# Patient Record
Sex: Female | Born: 1955 | Race: White | Hispanic: No | Marital: Married | Smoking: Former smoker
Health system: Southern US, Community
[De-identification: ages and names within clinical notes are randomized; demographics above are authoritative.]

## PROBLEM LIST (undated history)

## (undated) DIAGNOSIS — M549 Dorsalgia, unspecified: Secondary | ICD-10-CM

## (undated) DIAGNOSIS — F32A Depression, unspecified: Secondary | ICD-10-CM

## (undated) DIAGNOSIS — F329 Major depressive disorder, single episode, unspecified: Secondary | ICD-10-CM

## (undated) DIAGNOSIS — M1990 Unspecified osteoarthritis, unspecified site: Secondary | ICD-10-CM

## (undated) DIAGNOSIS — H269 Unspecified cataract: Secondary | ICD-10-CM

## (undated) DIAGNOSIS — G8929 Other chronic pain: Secondary | ICD-10-CM

## (undated) DIAGNOSIS — F419 Anxiety disorder, unspecified: Secondary | ICD-10-CM

## (undated) DIAGNOSIS — K219 Gastro-esophageal reflux disease without esophagitis: Secondary | ICD-10-CM

## (undated) DIAGNOSIS — M542 Cervicalgia: Secondary | ICD-10-CM

## (undated) HISTORY — PX: BREAST SURGERY: SHX581

## (undated) HISTORY — DX: Major depressive disorder, single episode, unspecified: F32.9

## (undated) HISTORY — PX: EYE SURGERY: SHX253

## (undated) HISTORY — DX: Other chronic pain: G89.29

## (undated) HISTORY — DX: Gastro-esophageal reflux disease without esophagitis: K21.9

## (undated) HISTORY — DX: Depression, unspecified: F32.A

## (undated) HISTORY — DX: Dorsalgia, unspecified: M54.9

## (undated) HISTORY — PX: CHOLECYSTECTOMY: SHX55

## (undated) HISTORY — DX: Unspecified cataract: H26.9

## (undated) HISTORY — DX: Unspecified osteoarthritis, unspecified site: M19.90

## (undated) HISTORY — DX: Cervicalgia: M54.2

## (undated) HISTORY — PX: OTHER SURGICAL HISTORY: SHX169

## (undated) HISTORY — PX: APPENDECTOMY: SHX54

## (undated) HISTORY — PX: ABDOMINAL HYSTERECTOMY: SHX81

## (undated) HISTORY — PX: FRACTURE SURGERY: SHX138

## (undated) HISTORY — PX: TONSILLECTOMY: SUR1361

## (undated) HISTORY — DX: Anxiety disorder, unspecified: F41.9

## (undated) HISTORY — PX: CERVICAL SPINE SURGERY: SHX589

## (undated) HISTORY — PX: BACK SURGERY: SHX140

## (undated) HISTORY — PX: WRIST SURGERY: SHX841

---

## 1998-07-25 ENCOUNTER — Ambulatory Visit (HOSPITAL_COMMUNITY): Admission: RE | Admit: 1998-07-25 | Discharge: 1998-07-25 | Payer: Self-pay | Admitting: Family Medicine

## 1998-07-25 ENCOUNTER — Encounter: Payer: Self-pay | Admitting: Family Medicine

## 1999-01-03 ENCOUNTER — Encounter: Payer: Self-pay | Admitting: Obstetrics and Gynecology

## 1999-01-03 ENCOUNTER — Encounter: Admission: RE | Admit: 1999-01-03 | Discharge: 1999-01-03 | Payer: Self-pay | Admitting: Obstetrics and Gynecology

## 2000-06-03 ENCOUNTER — Ambulatory Visit (HOSPITAL_COMMUNITY): Admission: RE | Admit: 2000-06-03 | Discharge: 2000-06-03 | Payer: Self-pay | Admitting: Family Medicine

## 2000-06-03 ENCOUNTER — Encounter: Payer: Self-pay | Admitting: Family Medicine

## 2001-07-06 ENCOUNTER — Ambulatory Visit (HOSPITAL_COMMUNITY): Admission: RE | Admit: 2001-07-06 | Discharge: 2001-07-06 | Payer: Self-pay | Admitting: Family Medicine

## 2001-07-06 ENCOUNTER — Encounter: Payer: Self-pay | Admitting: Family Medicine

## 2003-09-14 ENCOUNTER — Ambulatory Visit (HOSPITAL_COMMUNITY): Admission: RE | Admit: 2003-09-14 | Discharge: 2003-09-14 | Payer: Self-pay | Admitting: Plastic Surgery

## 2003-09-14 ENCOUNTER — Ambulatory Visit (HOSPITAL_BASED_OUTPATIENT_CLINIC_OR_DEPARTMENT_OTHER): Admission: RE | Admit: 2003-09-14 | Discharge: 2003-09-14 | Payer: Self-pay | Admitting: Plastic Surgery

## 2003-09-14 ENCOUNTER — Encounter (INDEPENDENT_AMBULATORY_CARE_PROVIDER_SITE_OTHER): Payer: Self-pay | Admitting: Specialist

## 2003-09-25 ENCOUNTER — Ambulatory Visit (HOSPITAL_COMMUNITY): Admission: RE | Admit: 2003-09-25 | Discharge: 2003-09-25 | Payer: Self-pay | Admitting: Family Medicine

## 2005-08-07 ENCOUNTER — Inpatient Hospital Stay (HOSPITAL_COMMUNITY): Admission: RE | Admit: 2005-08-07 | Discharge: 2005-08-07 | Payer: Self-pay | Admitting: Neurological Surgery

## 2005-08-18 ENCOUNTER — Encounter: Admission: RE | Admit: 2005-08-18 | Discharge: 2005-08-18 | Payer: Self-pay | Admitting: Neurological Surgery

## 2005-10-27 ENCOUNTER — Encounter: Admission: RE | Admit: 2005-10-27 | Discharge: 2005-10-27 | Payer: Self-pay | Admitting: Neurological Surgery

## 2006-02-01 ENCOUNTER — Encounter: Admission: RE | Admit: 2006-02-01 | Discharge: 2006-02-01 | Payer: Self-pay | Admitting: Neurological Surgery

## 2006-03-15 ENCOUNTER — Encounter: Admission: RE | Admit: 2006-03-15 | Discharge: 2006-03-15 | Payer: Self-pay | Admitting: Neurological Surgery

## 2006-06-21 ENCOUNTER — Encounter: Admission: RE | Admit: 2006-06-21 | Discharge: 2006-06-21 | Payer: Self-pay | Admitting: Neurological Surgery

## 2006-07-06 ENCOUNTER — Encounter: Admission: RE | Admit: 2006-07-06 | Discharge: 2006-07-06 | Payer: Self-pay | Admitting: Neurological Surgery

## 2006-09-06 ENCOUNTER — Encounter: Admission: RE | Admit: 2006-09-06 | Discharge: 2006-09-06 | Payer: Self-pay | Admitting: Neurological Surgery

## 2006-09-09 ENCOUNTER — Encounter: Admission: RE | Admit: 2006-09-09 | Discharge: 2006-09-09 | Payer: Self-pay | Admitting: Neurological Surgery

## 2006-09-30 ENCOUNTER — Ambulatory Visit (HOSPITAL_COMMUNITY): Admission: RE | Admit: 2006-09-30 | Discharge: 2006-09-30 | Payer: Self-pay | Admitting: Neurological Surgery

## 2007-03-21 ENCOUNTER — Encounter: Admission: RE | Admit: 2007-03-21 | Discharge: 2007-03-21 | Payer: Self-pay | Admitting: Neurological Surgery

## 2007-03-29 ENCOUNTER — Encounter: Admission: RE | Admit: 2007-03-29 | Discharge: 2007-03-29 | Payer: Self-pay | Admitting: Neurological Surgery

## 2007-04-27 ENCOUNTER — Inpatient Hospital Stay (HOSPITAL_COMMUNITY): Admission: RE | Admit: 2007-04-27 | Discharge: 2007-05-01 | Payer: Self-pay | Admitting: Neurological Surgery

## 2007-05-24 ENCOUNTER — Encounter: Admission: RE | Admit: 2007-05-24 | Discharge: 2007-05-24 | Payer: Self-pay | Admitting: Neurological Surgery

## 2007-06-20 IMAGING — CR DG CERVICAL SPINE 1V
1 series · 1 of 1 positions shown · non-contrast
Comparison: 08/05/05 operative films.

CLINICAL DATA: Status post ACDF.  
 SINGLE LATERAL CERVICAL SPINE:

[w c-spine lat]
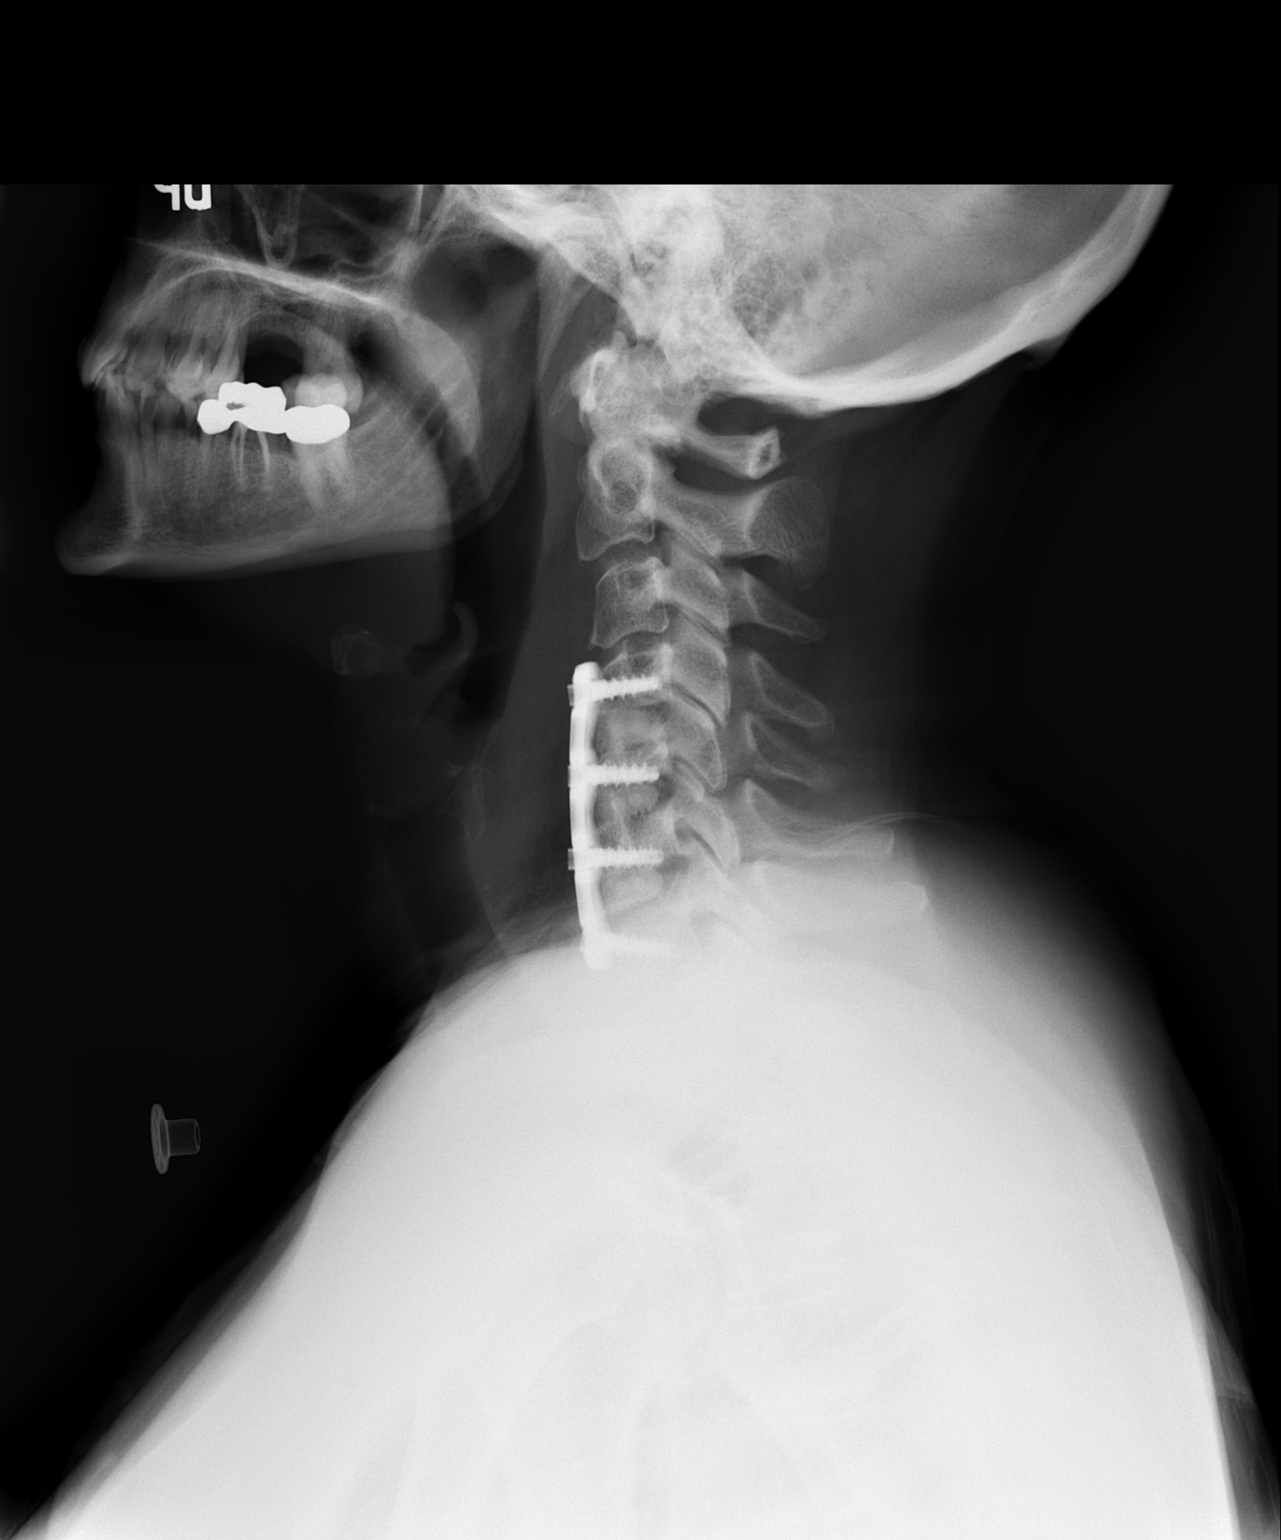

[1 of 1 positions shown; findings below may reference images not displayed]

A single lateral view of the cervical spine shows the patient to be status post diskectomy at C4-5, C5-6, and C6-7.  There is anterior plating spanning from the C4 vertebral body to C7 inclusively.  There is straightening of the normal lordosis with normal anatomic alignment.  Intervertebral spaces are well preserved at the fused levels.  No evidence for hardware complications.  Mild prominence of the prevertebral soft tissues noted.
IMPRESSION: 1.  Status post cervical fusion from C4 to C7 inclusively.  No hardware complications.
 2.  Mild prominence of the prevertebral soft tissues.

## 2007-07-25 ENCOUNTER — Encounter: Admission: RE | Admit: 2007-07-25 | Discharge: 2007-07-25 | Payer: Self-pay | Admitting: Neurological Surgery

## 2007-10-28 ENCOUNTER — Encounter: Admission: RE | Admit: 2007-10-28 | Discharge: 2007-10-28 | Payer: Self-pay | Admitting: Neurological Surgery

## 2008-04-23 ENCOUNTER — Encounter: Admission: RE | Admit: 2008-04-23 | Discharge: 2008-04-23 | Payer: Self-pay | Admitting: Neurological Surgery

## 2009-01-27 ENCOUNTER — Emergency Department (HOSPITAL_COMMUNITY): Admission: EM | Admit: 2009-01-27 | Discharge: 2009-01-27 | Payer: Self-pay | Admitting: Emergency Medicine

## 2009-01-28 IMAGING — RF DG MYELOGRAM CERVICAL
12 series · 12 of 12 positions shown · non-contrast
Comparison: Plain film radiographs 03/21/07 and MRI of the cervical spine 09/09/06.
COMPARISON: MRI 09/09/07.

CLINICAL DATA: 51-year-old female, neck pain.  
CERVICAL MYELOGRAM:
TECHNIQUE: Multidetector CT imaging of the cervical spine was performed after intrathecal injection of contrast.  Multiplanar CT image reconstructions were also generated.

[Series 1: (hospital) · 1 of 1 slices shown (1 of 2)]
[im 1/1]
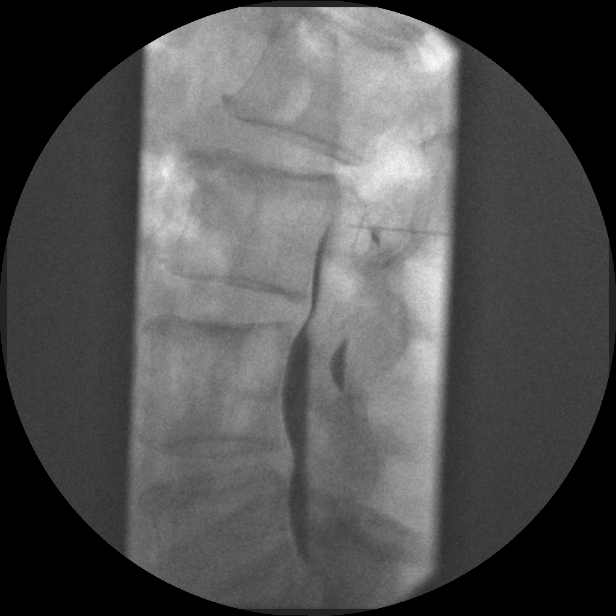

[Series 2: (hospital) · 1 of 1 slices shown (2 of 2)]
[im 1/1]
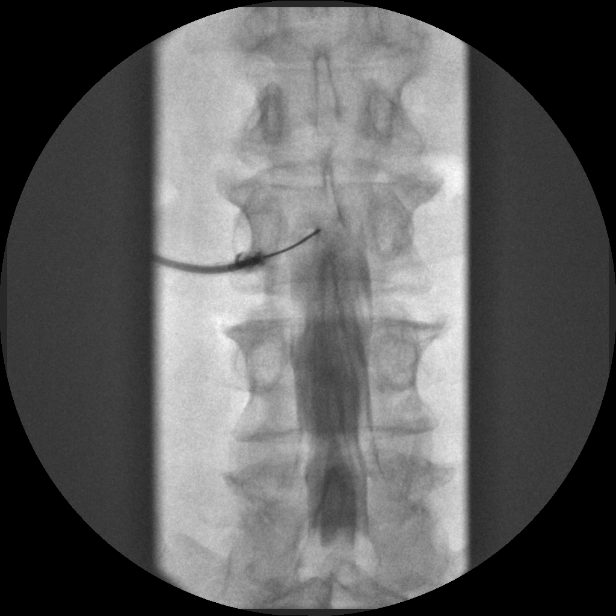

[Series 3: myelogram  white · 1 of 1 slices shown (1 of 10)]
[im 1/1]
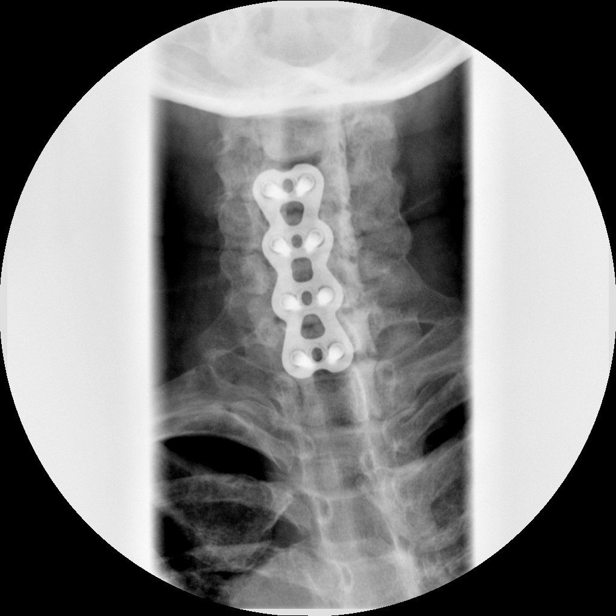

[Series 4: myelogram  white · 1 of 1 slices shown (2 of 10)]
[im 1/1]
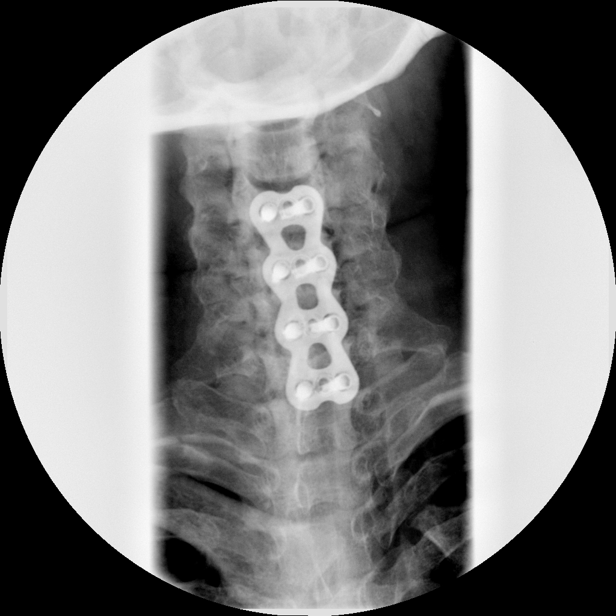

[Series 5: myelogram  white · 1 of 1 slices shown (3 of 10)]
[im 1/1]
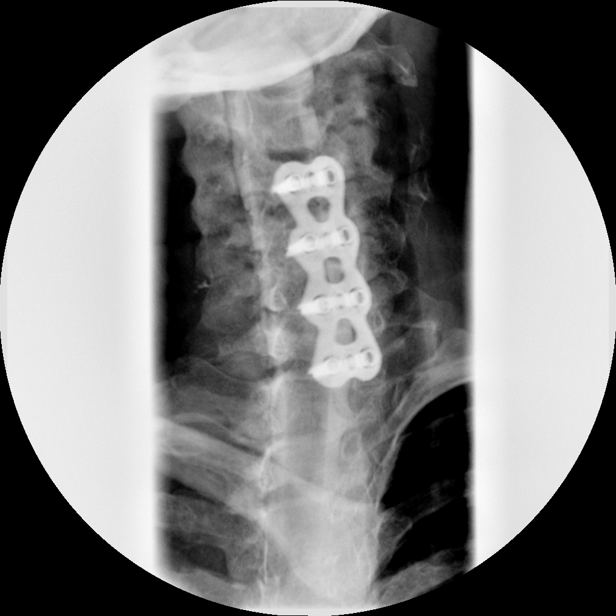

[Series 6: myelogram  white · 1 of 1 slices shown (4 of 10)]
[im 1/1]
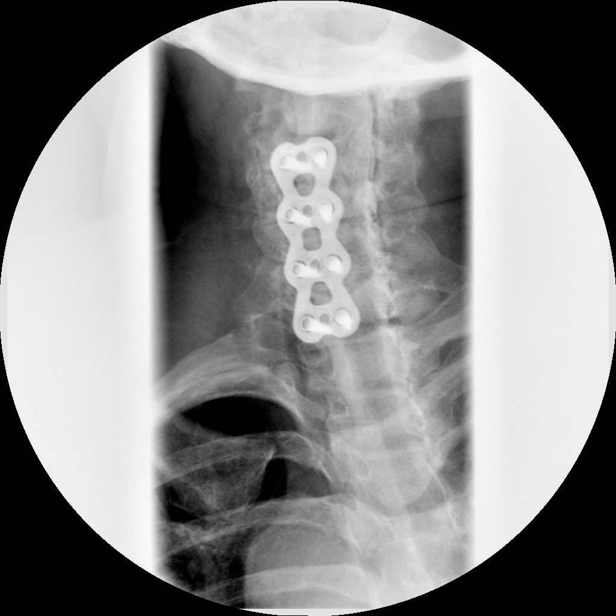

[Series 7: myelogram  white · 1 of 1 slices shown (5 of 10)]
[im 1/1]
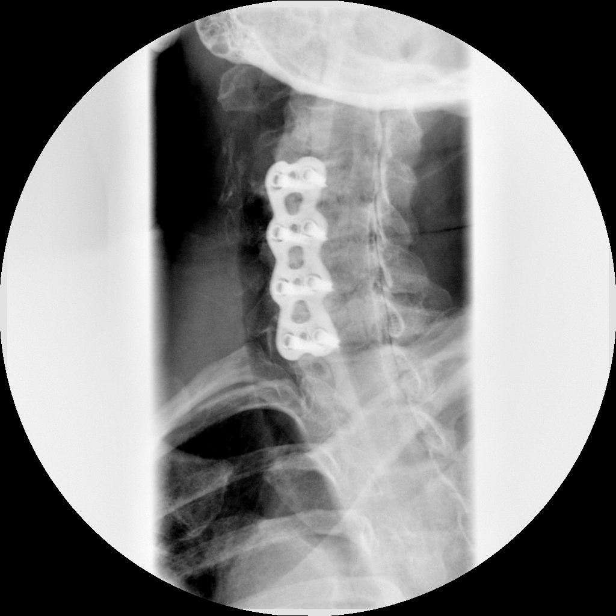

[Series 8: myelogram  white · 1 of 1 slices shown (6 of 10)]
[im 1/1]
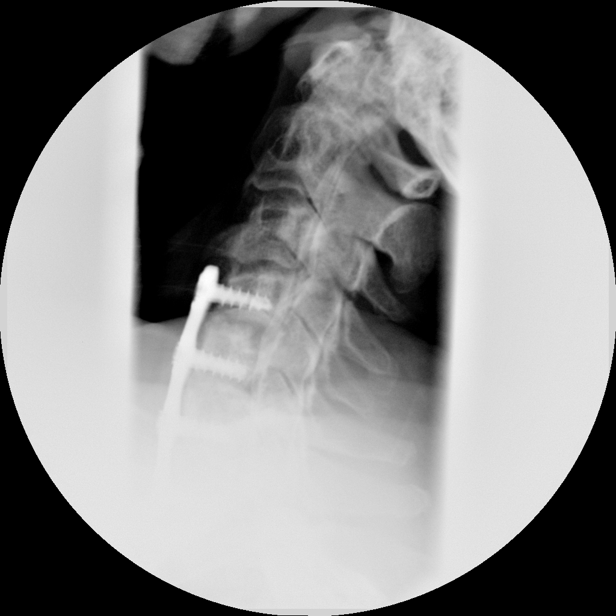

[Series 9: myelogram  white · 1 of 1 slices shown (7 of 10)]
[im 1/1]
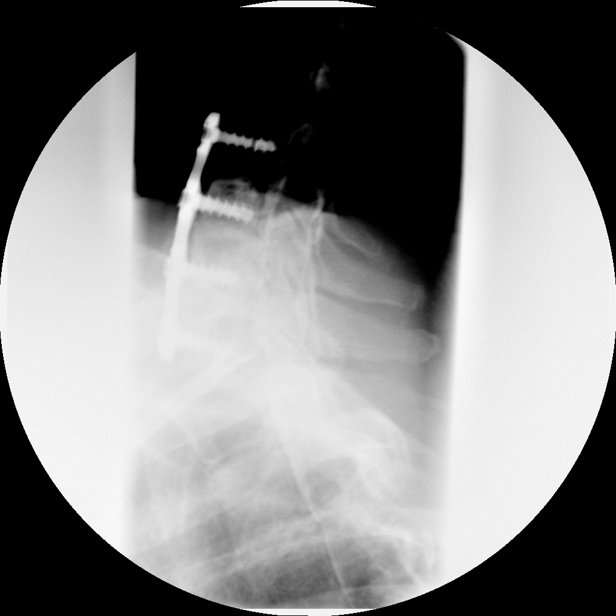

[Series 10: myelogram  white · 1 of 1 slices shown (8 of 10)]
[im 1/1]
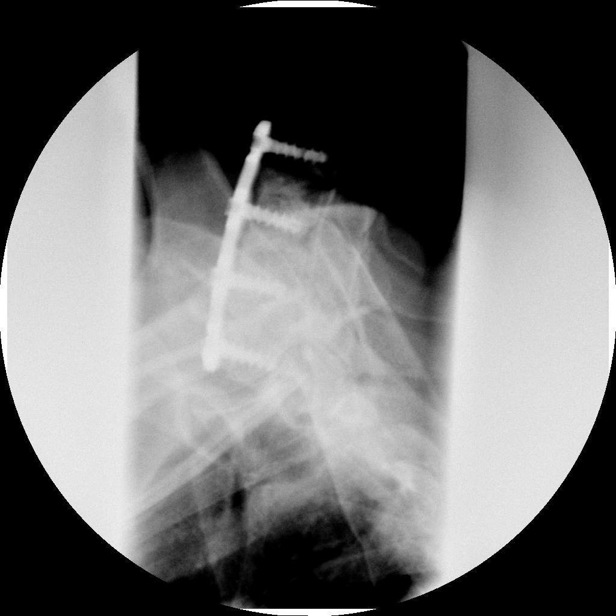

[Series 11: myelogram  white · 1 of 1 slices shown (9 of 10)]
[im 1/1]
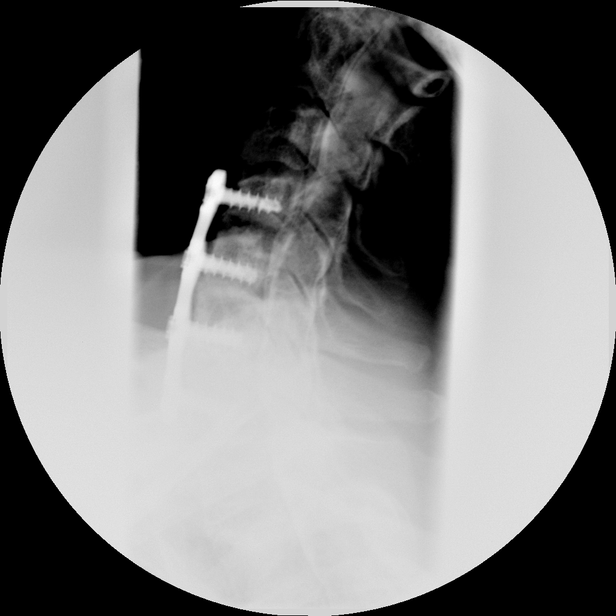

[Series 12: myelogram  white · 1 of 1 slices shown (10 of 10)]
[im 1/1]
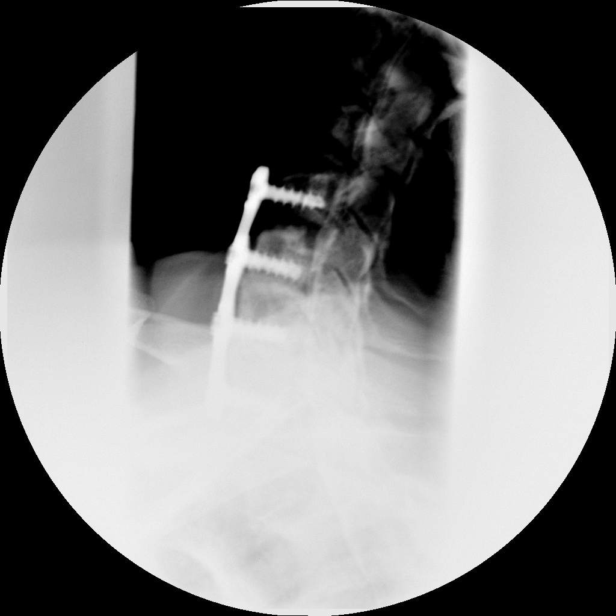

[12 of 12 positions shown; findings below may reference images not displayed]

Operator:  Lkw Tiger, M.D.
Procedure:  After obtained informed written consent for the procedure the patient was brought to fluoroscopy.  She was positioned prone.  The back was prepped and draped in the usual sterile fashion.  The L2-3 interspace was localized.  Superficial soft tissues were anesthetized with 1% Lidocaine.  A 22 gauge spinal needle was then advanced into the subarachnoid space from a left paramedian approach.  Clear CSF was returned.  Initial injection demonstrated a somewhat globular pattern.  I looked on the lateral and saw that contrast was in either the epidural or subdural space with minimal in the subarachnoid space.  I therefore replaced the stylette and advanced the needle slightly through the central portion of the canal.  Further injection of contrast was in the subarachnoid space.  A total of 10 cc of Omnipaque 300 was injected.  
The stylette was then removed.  The patient had some complaints of pressure in her legs.  She was then moved into a Trendelenburg position and contrast was followed into the cervical region.
FINDINGS: Conventional images demonstrate a disc bulge at the C3-4 level.  The patient was status-post fusion at C3-7.  Surgical hardware is intact.  Please see CT for further detail.  There is also a focal disc bulge at C7-T1.  The nerve roots appear to fill bilaterally.
IMPRESSION: 1.  Status-post ACDF C4-7.  
2.  Disc bulging at C3-4 and also at C7-T1. 
CT CERVICAL SPINE WITH CONTRAST (POST-MYELOGRAM):
FINDINGS: The patient is status-post anterior cervical discectomy and fusion C4-5.  There is lucency above the graft material at C4-5 and the C5-6 level.  Lucency is seen below the graft material at C6-7.  The screws are intact.  The anterior plate is intact.  The craniocervical junction is unremarkable.  There is slight anterolisthesis of C-3 on C-4, measuring 2 to 3 mm.  Alignment is otherwise maintained.  Individual disc levels are as follows:
There is ossification along the posteromedial aspect of the left C1-2 joint.  This is similar to the prior studies.  There is no associated stenosis.  
C2-3:  Slight anterolisthesis is stable measuring 1 to 2 mm.  There is facet spurring and asymmetric facet arthropathy on the right.  Facet spurring encroaches on the posterior aspect of the neural foramen.  The left facet and foramen are patent. 
C3-4:  As stated anterolisthesis of 2 to 3 mm.  There is a ventral disc protrusion which contacts and indents the ventral surface of the cord.  Uncovertebral spurring on the right contributes to mild foraminal narrowing. 
C4-5:  Patient fused at this level.  There is diffuse osteophytic ridging which effaces the ventral CSF without definitely contacting the cord.  Mild biforaminal narrowing is on the basis of uncovertebral spurring. 
C5-6:  Mild biforaminal narrowing is worse right than left.  This is primarily due to uncovertebral spurring.  Facet arthropathy is present. 
C6-7:  Central canals patent.  Mild biforaminal narrowing due to uncovertebral spurring.  
C7-T1:  A broad based central disc bulge contacts the ventral surface of the cord, effacing the CSF.  The foramina are patent.  
There is no significantdisc disease in the upper thoracic spine and the lung apices are clear.  The soft tissues are unremarkable.
IMPRESSION: 1.  Status-post ACDF C4-7.  There is no clear bridging bone at any fused levels. 
2.  Ventral disc protrusion with contract of the cord and mild to moderate central canal stenosis at C3-4. 
3.  Multilevel foraminal narrowing as described above due to uncovertebral spurring.  This appears to be greatest at C5-6 and C6-7, right greater than left. 
4.  Broad based disc bulging at C7-T1, just below the fusion.
5.  Slight anterolisthesis of C-2 on C-3 and C-3 on C-4.

## 2009-08-12 ENCOUNTER — Encounter: Admission: RE | Admit: 2009-08-12 | Discharge: 2009-08-12 | Payer: Self-pay | Admitting: Internal Medicine

## 2010-05-20 LAB — HEPATIC FUNCTION PANEL
ALT: 20 U/L (ref 0–35)
AST: 25 U/L (ref 0–37)
Albumin: 4.3 g/dL (ref 3.5–5.2)
Alkaline Phosphatase: 71 U/L (ref 39–117)
Total Protein: 6.9 g/dL (ref 6.0–8.3)

## 2010-05-20 LAB — DIFFERENTIAL
Eosinophils Absolute: 0 10*3/uL (ref 0.0–0.7)
Lymphocytes Relative: 16 % (ref 12–46)
Lymphs Abs: 1.7 10*3/uL (ref 0.7–4.0)
Neutro Abs: 8.3 10*3/uL — ABNORMAL HIGH (ref 1.7–7.7)
Neutrophils Relative %: 77 % (ref 43–77)

## 2010-05-20 LAB — BASIC METABOLIC PANEL
BUN: 21 mg/dL (ref 6–23)
Calcium: 8.9 mg/dL (ref 8.4–10.5)
Creatinine, Ser: 0.61 mg/dL (ref 0.4–1.2)
GFR calc Af Amer: >60 mL/min (ref >60)
GFR calc non Af Amer: >60 mL/min (ref >60)

## 2010-05-20 LAB — URINALYSIS, ROUTINE W REFLEX MICROSCOPIC
Ketones, ur: NEGATIVE mg/dL
Nitrite: NEGATIVE
Protein, ur: NEGATIVE mg/dL
pH: 7 (ref 5.0–8.0)

## 2010-05-20 LAB — CBC
Platelets: 256 10*3/uL (ref 150–400)
RBC: 4.3 MIL/uL (ref 3.87–5.11)
WBC: 10.8 10*3/uL — ABNORMAL HIGH (ref 4.0–10.5)

## 2010-07-01 NOTE — Op Note (Signed)
[name], [name]               ACCOUNT NO.:  [id number]   MEDICAL RECORD NO.:  [id number]          PATIENT TYPE:  INP   LOCATION:  2899                         FACILITY:  [facility]   PHYSICIAN:  Renee Alert, MD     DATE OF BIRTH:  August 26, 1955   DATE OF PROCEDURE:  04/27/2007  DATE OF DISCHARGE:                               OPERATIVE REPORT   PREOPERATIVE DIAGNOSIS:  1. Cervical disc herniation with cervical spondylosis and cervical      spinal stenosis C3-C4.  2. Pseudoarthrosis C4-C5, C5-C6, and C6-C7, status post anterior      cervical discectomy fusion and plating at those levels in the      remote past.   POSTOPERATIVE DIAGNOSIS:  1. Cervical disc herniation with cervical spondylosis and cervical      spinal stenosis C3-C4.  2. Pseudoarthrosis C4-C5, C5-C6, and C6-C7, status post anterior      cervical discectomy fusion and plating at those levels in the      remote past.   PROCEDURE:  1. Anterior cervical re-exploration with exploration of fusion C4-C5,      C5-C6, and C6-C7, with removal of anterior cervical hardware C4 to      C7.  2. Decompressive anterior cervical discectomy C3-C4 for central canal      decompression.  3. Anterior cervical arthrodesis C3-C4 utilizing a 7 mm      corticocancellous allograft.  4. Anterior cervical plating C3-C4 utilizing a 23 mm Zephir plate.  5. Posterior cervical fusion C3 to C7 utilizing Actifuse putty.  6. Posterior cervical segmental fixation C3 to C7 utilizing the Vertex      lateral mass screw system.   SURGEON:  [name], M.D.   ASSISTANT:  [name]. Pool, M.D.   ANESTHESIA:  General endotracheal anesthesia.   COMPLICATIONS:  None apparent.   INDICATIONS FOR PROCEDURE:  [name] is a 55 year old female who  underwent a three level anterior cervical discectomy in the remote past.  She had return of neck pain with bilateral arm pain with some numbness  in her left arm.  She had a MRI and then a CT myelogram  which showed a  pseudoarthrosis at C4-C5, C5-C6 and C6-C7.  There was a question that  she could have been fused at C5-C6, but C4-C5 and C6-C7 certainly looked  like they had a pseudoarthrosis.  She had an adjacent level disc  herniation at C3-C4 with what appeared to be a broken screw at C4. There  was stenosis at C3-C4. Because of the pseudoarthrosis and the stenosis  and return of symptoms, I recommended a cervical re-exploration with  removal of the anterior cervical plate followed by anterior cervical  discectomy fusion with plating C3-C4 to decompress the central canal  followed by posterior cervical instrumented fusion from C3 to C7 to  address her pseudoarthrosis.  She understood the risks, benefits, and  expected outcome and she wished to proceed.   DESCRIPTION OF PROCEDURE:  The patient was taken to the operating room  and after induction of adequate generalized endotracheal anesthesia, she  was placed in  the supine position on the operating room table.  Her  right anterior cervical region was prepped with DuraPrep and draped in  the usual sterile fashion.  4 mL local anesthesia was injected and a  transverse incision was made to the right of midline and carried down to  the platysma which was elevated, opened, and undermined with Metzenbaum  scissors.  I then dissected in a plane medial to the sternocleidomastoid  muscle and internal carotid artery and lateral to the trachea and  esophagus to expose the old plate and [id number].  Blunt dissection was used  to expose the plate followed by Bovie cautery.   The screws were removed and the plate was removed.  I inspected the  fusion.  It was difficult to tell whether she had a fusion or not from  this approach.  Therefore, I took down the longus colli muscle and  exposed the C3-C4 disc space.  Intraoperative fluoroscopy confirmed my  level at C3-4 and then the annulus was incised and the initial  discectomy was done with pituitary  rongeurs and curved curettes.  I then  used the high speed drill to drill the endplates at C3 and C4 to prepare  for arthrodesis.  I widened the disc to a height of 7 mm.  I drilled  laterally to the uncovertebral joints, drilling in a rectangular  fashion.  I drilled down to the level of the posterior longitudinal  ligament, the operating microscope was brought to the field.  The  posterior longitudinal ligament was opened with a black nerve hook and  then removed while undercutting the bodies of C3 and C4 to decompress  the central canal, especially the underside of C3.  I angled the scope  up and down to undercut the vertebral bodies.  Bilateral foraminotomies  were performed. The takeoff of the C4 nerve roots was identified.  I  then palpated with a nerve hook in a circumferential fashion, the dura  was no longer pushed away, it was now full and capacious all the way  across.  I felt like I had a good decompression at C3-C4.  Therefore, I  measured the interspace and used a 7 mm corticocancellous allograft,  tapped this in position at C3-C4, to complete the arthrodesis.  I was  going to use a Venture plate.  However, her spine at this level was too  narrow and, therefore, a Zephir plate was used.  Two 13 mm variable  angle screws were placed into the body of C3. At C4, I could place a  screw on the right but no screw could be placed on the patient's left  because of the remaining half of the broken screw.  Therefore, I  irrigated with saline solution containing bacitracin, dried all bleeding  points, and once meticulous hemostasis was achieved, placed a 7 flat JP  drain through a separate stab incision and closed the platysma with 3-0  Vicryl, closed the subcuticular tissue with 3-0 Vicryl, and closed the  skin with Benzoin and Steri-Strips.   The patient was then placed on the stretcher, once again. Her head was  affixed in a three point Mayfield headrest, and she was rolled into  the  prone position on chest rolls for the posterior cervical procedure.  The  posterior cervical region was prepped with DuraPrep and draped in the  usual sterile fashion.  10 mL of local anesthesia was injected. A dorsal  midline incision was made and carried down to the  cervical fascia.  The  fascia was opened and the paraspinous musculature was taken down in a  subperiosteal periosteal fashion to expose C3 to C7 inclusive  bilaterally.  Intraoperative fluoroscopy confirmed my level and then I  located the entry zones of the lateral mass screws. 1 mm medial to the  mid point of the lateral mass, I then drilled in an upward and outward  direction utilizing the hand drill with the 12 mm drill guide, drilling  into the safe zone of each lateral mass.  I palpated each lateral mass  with a ball probe after drilling and then used 12 mm lateral mass screws  placed in an upward and outward direction, again into the safe zone of  the lateral mass. Each one achieved a nice solid fit.  I then placed two  lordotic rods into the multiaxial screw heads and locked these into  position with the locking caps and the anti-torque device.  I then  decorticated the lateral masses and placed Actifuse out over these to  perform arthrodesis at C3 to C7 bilaterally.  We then placed a medium  Hemovac drain, dried all bleeding points, irrigated with saline solution  containing bacitracin, and then closed the fascia with 0 Vicryl, closed  the subcutaneous and subcuticular tissue with  2-0 and 3-0 Vicryl, closed the skin with Benzoin and Steri-Strips.  The  drapes were removed.  A sterile dressing was applied.  The patient was  awakened from general anesthesia and transferred to the recovery room in  stable condition.  At the end of the procedure, all sponge, needle and  instrument counts were correct.      Renee Alert, MD  Electronically Signed     DSJ/MEDQ  D:  04/27/2007  T:  04/28/2007  Job:   [id number]

## 2010-07-01 NOTE — Op Note (Signed)
[name], [name]               ACCOUNT NO.:  [id number]   MEDICAL RECORD NO.:  [id number]          PATIENT TYPE:  AMB   LOCATION:  SDS                          FACILITY:  [facility]   PHYSICIAN:  Tia Alert, MD     DATE OF BIRTH:  [birth year]   DATE OF PROCEDURE:  09/30/2006  DATE OF DISCHARGE:                               OPERATIVE REPORT   PREOPERATIVE DIAGNOSIS:  Hypertrophic cervical scar status post anterior  cervical diskectomy.   POSTOPERATIVE DIAGNOSIS:  Hypertrophic cervical scar status post  anterior cervical diskectomy.   PROCEDURE:  Excision of hypertrophic scar measuring 1 cm in length.   SURGEON:  Dr. Marikay Alar.   ANESTHESIA:  Local.   INDICATIONS FOR PROCEDURE:  [name] is a 55 year old female who  underwent a anterior cervical diskectomy fusion plating at three levels  over a year ago.  She had a small lesion in the wound that was firm and  hypermobile, which appeared to be some type of a small granuloma where  she likely had a previous small stitch abscess.  This bothers her  cosmetically and also was associated with some tenderness.  She asked  for it to be excised.  She understood the risks, benefits, expected  outcome and wished to proceed.   DESCRIPTION OF PROCEDURE:  The patient was taken to operating room and  placed on the operating room table.  Her right anterior cervical region  was prepped with DuraPrep and draped in the usual sterile fashion.  2 mL  local anesthesia was injected and small linear incision was made over  the lesion.  This was then noted to be what was felt to be hypertrophic  scar tissue just in the subcutaneous space.  Therefore I ellipsed this  out with a 15 blade scalpel.  I then dried the surgical bed with bipolar  cautery, inspected the lesion the make sure that I had gross total  excision of the lesion.  I felt like I did, therefore I placed two 3-0  Vicryl sutures and closed the skin with Benzoin Steri-Strips and  sterile  dressing was applied.  The patient was then taken to the recovery room  in stable condition.  At the end of the procedure all sponge, needle and  sponge counts were correct.      Tia Alert, MD  Electronically Signed     DSJ/MEDQ  D:  09/30/2006  T:  10/01/2006  Job:  [id number]

## 2010-07-04 NOTE — Op Note (Signed)
NAMESHADOE, CRYAN               ACCOUNT NO.:  [id number]   MEDICAL RECORD NO.:  [id number]          PATIENT TYPE:  OIB   LOCATION:  3172                         FACILITY:  [facility]   PHYSICIAN:  Tia Alert, MD     DATE OF BIRTH:  11-Apr-1955   DATE OF PROCEDURE:  08/05/2005  DATE OF DISCHARGE:                                 OPERATIVE REPORT   PREOPERATIVE DIAGNOSIS:  Cervical spondylosis with cervical spinal stenosis  C4-5 with foraminal stenosis C5-6 and C6-7 on the left with neck and left  arm pain.   POSTOPERATIVE DIAGNOSIS:  Cervical spondylosis with cervical spinal stenosis  C4-5 with foraminal stenosis C5-6 and C6-7 on the left with neck and left  arm pain.   PROCEDURE:  1.  Decompressive anterior cervical diskectomy C4-5, C5-6, and C6-7.  2.  Anterior cervical arthrodesis C4-5, C5-6, and C6-7 utilizing 7-mm VG-2      interbody grafts at C4-5 and C6-7 and a 6-mm VG-2 interbody graft at C5-      6.  3.  Anterior cervical plating C4-C7 inclusive, utilizing a 52.5-mm Atlantis      Vision plate.   SURGEON:  Tia Alert, MD   ASSISTANT:  Donalee Citrin, M.D.   ANESTHESIA:  General endotracheal.   COMPLICATIONS:  None apparent.   INDICATIONS FOR THE PROCEDURE:  [name] is a 55 year old white female who  presented with neck pain with left arm pain with numbness in her left arm  with progressive weakness in her left arm.  She had an MRI which showed  cervical spinal stenosis at C4-5 with spondylosis at C5-6 and C6-7 narrowing  of the neural foramen on the left with compression of the left C6 and C7  nerve roots.  She had tried medical management for quite some time without  significant relief.  I recommended a decompressive anterior cervical  diskectomy at C4-5, C5-6 and C6-7.  She understood the risks, the benefits,  and inspected outcome; and she wished to proceed.   DESCRIPTION OF PROCEDURE:  The patient was taken to the operating room after  induction of adequate  generalized endotracheal anesthesia, she was placed in  the supine position on the operating table.  Her right anterior cervical  region was prepped with DuraPrep and draped in the usual sterile fashion.  Then 5 mL of local anesthesia was injected; and a transverse incision was  made to the right of midline, and carried down to the platysma muscle which  was elevated, opened, and undermined with Metzenbaum scissors.   I then dissected in a plane medial to the sternocleidomastoid muscle and  internal carotid artery and lateral to the trachea and esophagus to expose  C4-C7.  Intraoperative fluoroscopy confirmed my level.  We then incised the  disk place at C4-5, C5-6 and C6-7, and did the initial diskectomy with  pituitary rongeurs, and curved curettes.  She had large anterior osteophytes  which were removed with a combination of Leksell rongeur, and a high-speed  drill to prepare for lighter plating.  We then used the high-speed drill to  widen  the interspaces at C4-5, C5-6 and C6-7 which for quite collapsed from  her spondylosis.  She had very severe degenerative disease.  We drilled down  to the level of the posterior ostial ligament at each level, widening the  disk space as we went.  At C4-5 we drilled her to a width of 7 mm, at C5-6  to 6 mm, and at C6-7 to 7 mm.  We then brought in the operating room  microscope.  We started at C4-5 and we opened up the posterior longitudinal  ligament with a nerve hook; and then removed this in a circumferential  fashion, with 1-and-2-mm Kerrison punches.  Bilateral foraminotomies were  performed.  The nerve roots were identified.  The pedicles were identified  and palpated.  We dissected out past the pedicle wall; and, again, bilateral  foraminotomies were performed.  We were careful to undercut the bodies of C4  and C5 to decompress the central canal.  The exact same procedures were done  at C5-6 and C6-7 taking care to undercut the bodies of C5,  C6, and C7 to  decompress the central canal.  Bilateral foraminotomies were performed and  the C6 and C7 nerve roots were identified.  We marched along the superior  endplate to find the pedicle and then followed the pedicle out into the  foramen at each level, decompressing the nerve root as we went along.   After the decompression was complete, we could see the cord pulsatile  through the dura.  We irrigated with saline solution containing bacitracin,  dried our surgical bed with Gelfoam which was removed, measured our  interspaces, once again, and placed a 7-mm VG-2 interbody graft at C4-5 and  at C6-7 and a 6-mm graft at C5-6.  We then used a 52.5 mm venture plate and  placed two 13-mm variable angle screws in the body of C4, C5, C6, and C7.  These locked into plate by the locking mechanism on the plate.   We then irrigated with saline solution containing bacitracin.  We spent  considerable time, more than 20 minutes drying up with the bipolar cautery;  and with 2 different Surgiflow syringes; and then placed a 7-flat JP drain;  and once meticulous hemostasis was achieved, closed the platysma with 3-0  Vicryl, closed the subcuticular tissues with 3-0 Vicryl, and closed the skin  with Benzoin and Steri-Strips.  The drapes were removed.  A sterile dressing  was applied.  The patient was awakened from general anesthesia and  transported to the recovery room in stable condition.  At the end of  procedure all sponge, needle, and instrument counts were correct.      Tia Alert, MD  Electronically Signed     DSJ/MEDQ  D:  08/05/2005  T:  08/05/2005  Job:  [id number]

## 2010-07-04 NOTE — Discharge Summary (Signed)
[name], [name]               ACCOUNT NO.:  [id number]   MEDICAL RECORD NO.:  [id number]          PATIENT TYPE:  INP   LOCATION:  3028                         FACILITY:  [facility]   PHYSICIAN:  Tia Alert, MD     DATE OF BIRTH:  27-Nov-1955   DATE OF ADMISSION:  04/27/2007  DATE OF DISCHARGE:  05/01/2007                               DISCHARGE SUMMARY   ADMITTING DIAGNOSES:  1. Adjacent level stenosis, C3-4.  2. Pseudoarthrosis, C4-5, C5-6, and C6-7.   PROCEDURE:  1. Anterior cervical diskectomy, fusion, and plating at C3-4.  2. Posterior cervical fusion and fixation, C3-C7.   BRIEF HISTORY OF PRESENT ILLNESS:  [name] is a very pleasant female  who underwent a previous anterior cervical diskectomy, fusion, and  plating at C4-5, C5-6, and C6-7.  Over the time, she developed some left  arm numbness, tingling, and pain.  Followup CT scan showed a  pseudoarthrosis at all 3 levels unfortunately.  She did have adjacent  level stenosis at C3-4.  We recommended a cervical reexploration with  the removal of plate from W2-N5 with an anterior cervical diskectomy,  fusion, and plating at C3-4 followed by a posterior cervical fusion and  fixation from C3-C7.  She understood the risk, benefits, and expected  outcome, and wished to proceed.   HOSPITAL COURSE:  The patient was admitted on 04/27/2007, and taken to  the operating room where she underwent a anterior cervical diskectomy,  fusion, and plating at C3-4 with the removal of plates from A2-Z3  followed by a posterior cervical instrumented fusion from C3-C7.  The  patient tolerated this procedure well, was taken to the recovery room  and then to the floor in stable condition.  For details of the operative  procedure, please see the dictated operative note.  This hospital course  was fairly routine.  She did have some problems with pain control, had  no real difficulty swallowing, and had improvement of her radicular  symptoms.   She was ambulating without difficulty.  She used a PCA for  pain control.  She remained afebrile with stable vital signs.  Her  neurologic exam remained intact.  She discontinued PCA on postoperative  day 2.  Continued to progress nicely and was discharged to home in  stable condition on 05/01/2007 with plans to follow up in 2 weeks.   FINAL DIAGNOSES:  1. Anterior cervical diskectomy, fusion, and plating at C3-4.  2. Posterior cervical arthrodesis, C3-7.      Tia Alert, MD  Electronically Signed     DSJ/MEDQ  D:  06/03/2007  T:  06/04/2007  Job:  [id number]

## 2010-07-04 NOTE — Op Note (Signed)
[name], VANTIL                         ACCOUNT NO.:  [id number]   MEDICAL RECORD NO.:  [id number]                   PATIENT TYPE:  AMB   LOCATION:  DSC                                  FACILITY:  [facility]   PHYSICIAN:  Etter Sjogren, M.D.                  DATE OF BIRTH:  January 06, 1956   DATE OF PROCEDURE:  09/14/2003  DATE OF DISCHARGE:                                 OPERATIVE REPORT   PREOPERATIVE DIAGNOSES:  1. Suspicious lesion, right back, less than 0.5 cm.  2. Suspicious lesion, left back, greater than 0.5 cm.   POSTOPERATIVE DIAGNOSES:  1. Suspicious lesion, right back, less than 0.5 cm.  2. Suspicious lesion, left back, greater than 0.5 cm.  3. Complicated open wounds of the back, 2.0 cm.   PROCEDURES PERFORMED:  1. Excision of suspicious lesion, back, less than 0.5 cm.  2. Excision, lesion of left back, greater than 0.5 cm.  3. Complex wound closure of back, 2.0 cm.   SURGEON:  Etter Sjogren, M.D.   ANESTHESIA:  1% Xylocaine with epinephrine plus bicarb.   CLINICAL NOTE:  A 55 year old woman, suspicious pigmented lesions with  irregular borders that had enlarged and changed, and it is medically  necessary to remove them.  The nature of the procedure and risks were well-  understood by her, including the possibility of further surgery depending  upon the final pathology report.  She wished to proceed.   DESCRIPTION OF PROCEDURE:  The patient was placed prone on the operating  table.  She was prepped with Betadine and draped with sterile drapes.  After  satisfactory local anesthesia achieved, the elliptical excisions were  performed, taking a margin of grossly normal skin around these lesions.  The  wounds were irrigated thoroughly and the closure was in layers with 4-0  Monocryl interrupted inverted deep and deep dermal sutures and running 4-0  Monocryl subcuticular suture.  Steri-Strips and dry sterile dressing  applied.  Tolerated it well.   DISPOSITION:  Recheck  in the office in about 10 days.                                               Etter Sjogren, M.D.    DB/MEDQ  D:  09/14/2003  T:  09/14/2003  Job:  [id number]

## 2010-11-10 LAB — DIFFERENTIAL
Basophils Relative: 0
Eosinophils Absolute: 0.2
Monocytes Absolute: 0.7
Monocytes Relative: 8
Neutrophils Relative %: 61

## 2010-11-10 LAB — BASIC METABOLIC PANEL
CO2: 25
Chloride: 106
Creatinine, Ser: 0.71
GFR calc Af Amer: >60
Glucose, Bld: 79

## 2010-11-10 LAB — CBC
MCHC: 34.5
MCV: 93.8
RBC: 4.34

## 2010-11-10 LAB — PROTIME-INR: INR: 0.9

## 2010-12-01 LAB — CBC
HCT: 40.3
Hemoglobin: 14
MCHC: 34.7
RDW: 14.7 — ABNORMAL HIGH

## 2012-07-16 ENCOUNTER — Encounter (HOSPITAL_COMMUNITY): Payer: Self-pay | Admitting: *Deleted

## 2012-07-16 ENCOUNTER — Emergency Department (HOSPITAL_COMMUNITY)
Admission: EM | Admit: 2012-07-16 | Discharge: 2012-07-16 | Disposition: A | Discharge status: Home / Self Care | Payer: Self-pay | Attending: Emergency Medicine | Admitting: Emergency Medicine

## 2012-07-16 DIAGNOSIS — Z23 Encounter for immunization: Secondary | ICD-10-CM | POA: Insufficient documentation

## 2012-07-16 DIAGNOSIS — S61409A Unspecified open wound of unspecified hand, initial encounter: Secondary | ICD-10-CM | POA: Insufficient documentation

## 2012-07-16 DIAGNOSIS — Y9301 Activity, walking, marching and hiking: Secondary | ICD-10-CM | POA: Insufficient documentation

## 2012-07-16 DIAGNOSIS — S31109A Unspecified open wound of abdominal wall, unspecified quadrant without penetration into peritoneal cavity, initial encounter: Secondary | ICD-10-CM | POA: Insufficient documentation

## 2012-07-16 DIAGNOSIS — F17200 Nicotine dependence, unspecified, uncomplicated: Secondary | ICD-10-CM | POA: Insufficient documentation

## 2012-07-16 DIAGNOSIS — Y929 Unspecified place or not applicable: Secondary | ICD-10-CM | POA: Insufficient documentation

## 2012-07-16 DIAGNOSIS — S41109A Unspecified open wound of unspecified upper arm, initial encounter: Secondary | ICD-10-CM | POA: Insufficient documentation

## 2012-07-16 DIAGNOSIS — W540XXA Bitten by dog, initial encounter: Secondary | ICD-10-CM | POA: Insufficient documentation

## 2012-07-16 DIAGNOSIS — Z79899 Other long term (current) drug therapy: Secondary | ICD-10-CM | POA: Insufficient documentation

## 2012-07-16 MED ORDER — OXYCODONE-ACETAMINOPHEN 5-325 MG PO TABS
1.0000 | ORAL_TABLET | Freq: Once | ORAL | Status: AC
  Administered 2012-07-16: 1 via ORAL
  Filled 2012-07-16: qty 1

## 2012-07-16 MED ORDER — RABIES IMMUNE GLOBULIN 150 UNIT/ML IM INJ
20.0000 [IU]/kg | INJECTION | Freq: Once | INTRAMUSCULAR | Status: AC
  Administered 2012-07-16: 1125 [IU]
  Filled 2012-07-16: qty 8

## 2012-07-16 MED ORDER — HYDROMORPHONE HCL PF 2 MG/ML IJ SOLN
2.0000 mg | Freq: Once | INTRAMUSCULAR | Status: AC
  Administered 2012-07-16: 2 mg via INTRAMUSCULAR
  Filled 2012-07-16: qty 1

## 2012-07-16 MED ORDER — AMOXICILLIN-POT CLAVULANATE 875-125 MG PO TABS: 1.0000 | ORAL_TABLET | Freq: Two times a day (BID) | ORAL | Status: DC

## 2012-07-16 MED ORDER — ONDANSETRON 4 MG PO TBDP
4.0000 mg | ORAL_TABLET | Freq: Once | ORAL | Status: AC
  Administered 2012-07-16: 4 mg via ORAL
  Filled 2012-07-16: qty 1

## 2012-07-16 MED ORDER — RABIES VACCINE, PCEC IM SUSR
1.0000 mL | Freq: Once | INTRAMUSCULAR | Status: AC
  Administered 2012-07-16: 1 mL via INTRAMUSCULAR
  Filled 2012-07-16: qty 1

## 2012-07-16 NOTE — ED Notes (Signed)
Copy of Pt signature page faxed to pharmacy and [facility]

## 2012-07-16 NOTE — ED Provider Notes (Signed)
History     CSN: [id number]  Arrival date & time 07/16/12  1330   First MD Initiated Contact with Patient 07/16/12 1352      Chief Complaint  Patient presents with  . Animal Bite    (Consider location/radiation/quality/duration/timing/severity/associated sxs/prior treatment) HPI Comments: Patient was taking a walk with her friend when she was attacked by a large unknown dog who was unleashed. Dog was wearing a collar however rabies status is unknown. Patient bit on bilateral hands, abdomen, and right axilla. Wounds were covered with neosporin and steristrips applied PTA. Tetanus UTD. No other tx PTA. Tetanus is UTD (3-4 years ago). The onset of this condition was acute. The course is constant. Aggravating factors: none. Alleviating factors: none. Daughter contacting animal control.    Patient is a 57 y.o. female presenting with animal bite. The history is provided by the patient.  Animal Bite Associated symptoms: no fever and no rash     History reviewed. No pertinent past medical history.  Past Surgical History  Procedure Laterality Date  . Wrist surgery    . Left shoulder surgery      No family history on file.  History  Substance Use Topics  . Smoking status: Current Every Day Smoker  . Smokeless tobacco: Not on file  . Alcohol Use: Not on file     Comment: occ    OB History   Grav Para Term Preterm Abortions TAB SAB Ect Mult Living                  Review of Systems  Constitutional: Negative for fever.  HENT: Negative for sore throat.   Eyes: Negative for redness.  Respiratory: Negative for cough.   Cardiovascular: Negative for chest pain.  Gastrointestinal: Positive for abdominal pain (superficial from bite). Negative for nausea and vomiting.  Genitourinary: Negative for dysuria.  Musculoskeletal: Negative for myalgias.  Skin: Positive for wound. Negative for rash.  Neurological: Negative for headaches.    Allergies  Codeine  Home Medications    Current Outpatient Rx  Name  Route  Sig  Dispense  Refill  . ALPRAZolam (XANAX) 1 MG tablet   Oral   Take 1 mg by mouth 2 (two) times daily as needed for anxiety.         Marland Kitchen aspirin EC 81 MG tablet   Oral   Take 81 mg by mouth daily.         . celecoxib (CELEBREX) 200 MG capsule   Oral   Take 200 mg by mouth daily.         . cyclobenzaprine (FLEXERIL) 10 MG tablet   Oral   Take 10 mg by mouth at bedtime as needed for muscle spasms.         . Multiple Vitamins-Minerals (MULTIVITAMIN PO)   Oral   Take 1 tablet by mouth daily.         Marland Kitchen oxyCODONE-acetaminophen (PERCOCET) 10-325 MG per tablet   Oral   Take 1 tablet by mouth every 4 (four) hours as needed for pain.         . vitamin B-12 (CYANOCOBALAMIN) 1000 MCG tablet   Oral   Take 1,000 mcg by mouth daily.         . vitamin E 1000 UNIT capsule   Oral   Take 1,000 Units by mouth daily.         Marland Kitchen amoxicillin-clavulanate (AUGMENTIN) 875-125 MG per tablet   Oral   Take 1 tablet by  mouth every 12 (twelve) hours.   10 tablet   0     BP 138/94  Pulse 116  Temp(Src) 98.4 F (36.9 C) (Oral)  Resp 16  SpO2 100%  Physical Exam  Nursing note and vitals reviewed. Constitutional: She appears well-developed and well-nourished.  HENT:  Head: Normocephalic and atraumatic.  Eyes: Pupils are equal, round, and reactive to light.  Neck: Normal range of motion. Neck supple.  Cardiovascular: Exam reveals no decreased pulses.   Musculoskeletal: She exhibits tenderness. She exhibits no edema.  Left hand: scattered superficial abrasions, small <65mm laceration overlying dorsum of left hand Abdomen: superficial abrasions in bite pattern Right axilla: laceration with venous oozing, visible exposed adipose tissue, surrounding abrasions Right hand: scattered superficial abrasions  Neurological: She is alert. No sensory deficit.  Motor, sensation, and vascular distal to the injury is fully intact.   Skin: Skin is warm  and dry.  Psychiatric: She has a normal mood and affect.    ED Course  Procedures (including critical care time)  Labs Reviewed - No data to display No results found.   1. Dog bite, initial encounter   2. Need for prophylactic vaccination against rabies     2:01 PM Patient seen and examined. Work-up initiated. Medications ordered. Tetanus UTD.   Vital signs reviewed and are as follows: Filed Vitals:   07/16/12 1335  BP: 138/94  Pulse: 116  Temp: 98.4 F (36.9 C)  Resp: 16   Pain improved with dilaudid. Wounds were cleaned with skin scrub, saline, gauze, wound cleanser by nurse tech and myself.  Three loose sutures were placed in right axilla wound for hemostasis and improved cosmesis.  Patient will use chronic pain medications at home.  Patient counseled on wound care and need to return for wound check and rabies vaccines on days 3, 7, 14 if animal control cannot quarantine the animal.  LACERATION REPAIR Performed by: Carolee Rota Authorized by: Carolee Rota Consent: Verbal consent obtained. Risks and benefits: risks, benefits and alternatives were discussed Consent given by: patient Patient identity confirmed: provided demographic data Prepped and Draped in normal sterile fashion Wound explored  Laceration Location: anterior R axilla  Laceration Length: 3cm  No Foreign Bodies seen or palpated  Anesthesia: local infiltration  Local anesthetic: lidocaine 2% with epinephrine  Anesthetic total: 4 ml  Irrigation method: skin scrub with saline and dermal cleanser Amount of cleaning: standard  Skin closure: 5-0 Chromic gut  Number of sutures: 3  Technique: loose simple interrupted  Patient tolerance: Patient tolerated the procedure well with no immediate complications.  Pt urged to return with worsening pain, worsening swelling, expanding area of redness or streaking up extremity, fever, or any other concerns. Urged to take complete course of  antibiotics as prescribed.  Counseled to take home pain medications as prescribed. Pt verbalizes understanding and agrees with plan.  BP 134/68  Pulse 88  Temp(Src) 98.4 F (36.9 C) (Oral)  Resp 20  Ht 4\' 11"  (1.499 m)  Wt 120 lb (54.432 kg)  BMI 24.22 kg/m2  SpO2 99%    MDM  Patient with multiple superficial abrasions and small lacerations due to dog bite. Rabies prophylaxis was given due 2 unknown animal and unknown if animal can be captured. Patient is in the process of following up with animal control. Tetanus is up-to-date. Several loose sutures placed to gaping superficial wound in right axilla after cleaning. Patient instructed on need for rabies vaccination if animal control cannot identify the dog that bit  her. Patient appears well at time of discharge.        Renne Crigler, PA-C 07/16/12 1725

## 2012-07-16 NOTE — ED Notes (Signed)
PT given a copy of future appts for rabies shots. This information was reviewed with PT and a friend. Pt voiced the understanding of need to return for all shots on the days listed on form.

## 2012-07-16 NOTE — ED Notes (Signed)
Pt was taking a walk and a dog came at patient.  Pt does not know dog.   It happened in [place], [place], today, at 1230.  Pt has bites to left hand, left abdomen has superficial bites with bruising, and has bites under left axilla area.  Bleeding controlled

## 2012-07-16 NOTE — ED Notes (Signed)
Pt given rabies d/c follow up instructions

## 2012-07-17 NOTE — ED Provider Notes (Signed)
Medical screening examination/treatment/procedure(s) were performed by non-physician practitioner and as supervising physician I was immediately available for consultation/collaboration.  I was immediately available for assistance with wound care and closure if needed.  Gavin Pound. Noboru Bidinger, MD 07/17/12 1058

## 2013-03-17 ENCOUNTER — Encounter: Payer: Self-pay | Admitting: Internal Medicine

## 2013-12-05 ENCOUNTER — Encounter: Payer: Self-pay | Admitting: Internal Medicine

## 2015-09-04 ENCOUNTER — Encounter (INDEPENDENT_AMBULATORY_CARE_PROVIDER_SITE_OTHER): Payer: Self-pay | Admitting: Ophthalmology

## 2015-09-05 ENCOUNTER — Encounter (INDEPENDENT_AMBULATORY_CARE_PROVIDER_SITE_OTHER): Payer: Medicare Other | Admitting: Ophthalmology

## 2015-09-05 DIAGNOSIS — H2513 Age-related nuclear cataract, bilateral: Secondary | ICD-10-CM | POA: Diagnosis not present

## 2015-09-05 DIAGNOSIS — H33303 Unspecified retinal break, bilateral: Secondary | ICD-10-CM | POA: Diagnosis not present

## 2015-09-05 DIAGNOSIS — H35413 Lattice degeneration of retina, bilateral: Secondary | ICD-10-CM | POA: Diagnosis not present

## 2015-09-09 ENCOUNTER — Encounter (INDEPENDENT_AMBULATORY_CARE_PROVIDER_SITE_OTHER): Payer: Medicare Other | Admitting: Ophthalmology

## 2015-09-09 DIAGNOSIS — H33302 Unspecified retinal break, left eye: Secondary | ICD-10-CM | POA: Diagnosis not present

## 2015-09-26 ENCOUNTER — Ambulatory Visit (INDEPENDENT_AMBULATORY_CARE_PROVIDER_SITE_OTHER): Payer: Medicare Other | Admitting: Ophthalmology

## 2015-09-30 ENCOUNTER — Ambulatory Visit (INDEPENDENT_AMBULATORY_CARE_PROVIDER_SITE_OTHER): Payer: Medicare Other | Admitting: Ophthalmology

## 2015-09-30 DIAGNOSIS — H33303 Unspecified retinal break, bilateral: Secondary | ICD-10-CM

## 2016-01-30 ENCOUNTER — Ambulatory Visit (INDEPENDENT_AMBULATORY_CARE_PROVIDER_SITE_OTHER): Payer: Medicare Other | Admitting: Ophthalmology

## 2016-01-30 DIAGNOSIS — H2512 Age-related nuclear cataract, left eye: Secondary | ICD-10-CM | POA: Diagnosis not present

## 2016-01-30 DIAGNOSIS — H43813 Vitreous degeneration, bilateral: Secondary | ICD-10-CM | POA: Diagnosis not present

## 2016-01-30 DIAGNOSIS — H33303 Unspecified retinal break, bilateral: Secondary | ICD-10-CM

## 2016-11-10 ENCOUNTER — Encounter (INDEPENDENT_AMBULATORY_CARE_PROVIDER_SITE_OTHER): Payer: Medicare Other | Admitting: Ophthalmology

## 2016-11-10 DIAGNOSIS — H538 Other visual disturbances: Secondary | ICD-10-CM | POA: Diagnosis not present

## 2016-11-10 DIAGNOSIS — H43813 Vitreous degeneration, bilateral: Secondary | ICD-10-CM

## 2016-11-10 DIAGNOSIS — H33303 Unspecified retinal break, bilateral: Secondary | ICD-10-CM

## 2016-11-10 DIAGNOSIS — H5340 Unspecified visual field defects: Secondary | ICD-10-CM

## 2016-12-09 ENCOUNTER — Encounter: Payer: Self-pay | Admitting: Gastroenterology

## 2017-01-19 ENCOUNTER — Ambulatory Visit (AMBULATORY_SURGERY_CENTER): Payer: Self-pay

## 2017-01-19 ENCOUNTER — Other Ambulatory Visit: Payer: Self-pay

## 2017-01-19 VITALS — Ht 59.0 in | Wt 130.0 lb

## 2017-01-19 DIAGNOSIS — Z1211 Encounter for screening for malignant neoplasm of colon: Secondary | ICD-10-CM

## 2017-01-19 MED ORDER — PEG-KCL-NACL-NASULF-NA ASC-C 140 G PO SOLR: 1.0000 | Freq: Once | ORAL | 0 refills | Status: AC

## 2017-01-19 NOTE — Progress Notes (Signed)
Denies allergies to eggs or soy products. Denies complication of anesthesia or sedation. Denies use of weight loss medication. Denies use of O2.   Emmi instructions declined.  

## 2017-01-20 ENCOUNTER — Encounter: Payer: Self-pay | Admitting: Gastroenterology

## 2017-02-02 ENCOUNTER — Ambulatory Visit (AMBULATORY_SURGERY_CENTER): Payer: Medicare Other | Admitting: Gastroenterology

## 2017-02-02 ENCOUNTER — Telehealth: Payer: Self-pay | Admitting: *Deleted

## 2017-02-02 ENCOUNTER — Telehealth: Payer: Self-pay | Admitting: Gastroenterology

## 2017-02-02 ENCOUNTER — Encounter: Payer: Self-pay | Admitting: Gastroenterology

## 2017-02-02 VITALS — BP 139/73 | HR 67 | Temp 98.4°F | Resp 14 | Ht 59.0 in | Wt 130.0 lb

## 2017-02-02 DIAGNOSIS — D122 Benign neoplasm of ascending colon: Secondary | ICD-10-CM | POA: Diagnosis not present

## 2017-02-02 DIAGNOSIS — Z1211 Encounter for screening for malignant neoplasm of colon: Secondary | ICD-10-CM | POA: Diagnosis present

## 2017-02-02 DIAGNOSIS — Z1212 Encounter for screening for malignant neoplasm of rectum: Secondary | ICD-10-CM

## 2017-02-02 MED ORDER — SODIUM CHLORIDE 0.9 % IV SOLN: 500.0000 mL | Freq: Once | INTRAVENOUS | Status: DC

## 2017-02-02 NOTE — Op Note (Signed)
[place] Patient Name: [name] Procedure Date: 02/02/2017 11:28 AM MRN: [id number] Endoscopist: Mauri Pole , MD Age: 61 Referring MD:  Date of Birth: 29-Jan-1956 Gender: Female Account #: [id number] Procedure:                Colonoscopy Indications:              Screening for colorectal malignant neoplasm, Last                            colonoscopy: 2005 Medicines:                Monitored Anesthesia Care Procedure:                Pre-Anesthesia Assessment:                           - Prior to the procedure, a History and Physical                            was performed, and patient medications and                            allergies were reviewed. The patient's tolerance of                            previous anesthesia was also reviewed. The risks                            and benefits of the procedure and the sedation                            options and risks were discussed with the patient.                            All questions were answered, and informed consent                            was obtained. Prior Anticoagulants: The patient has                            taken no previous anticoagulant or antiplatelet                            agents. ASA Grade Assessment: II - A patient with                            mild systemic disease. After reviewing the risks                            and benefits, the patient was deemed in                            satisfactory condition to undergo the procedure.  After obtaining informed consent, the colonoscope                            was passed under direct vision. Throughout the                            procedure, the patient's blood pressure, pulse, and                            oxygen saturations were monitored continuously. The                            Colonoscope was introduced through the anus and                            advanced to the the cecum, identified  by                            appendiceal orifice and ileocecal valve. The                            colonoscopy was performed without difficulty. The                            patient tolerated the procedure well. The quality                            of the bowel preparation was good. The ileocecal                            valve, appendiceal orifice, and rectum were                            photographed. Scope In: 11:30:06 AM Scope Out: 11:42:10 AM Scope Withdrawal Time: 0 hours 8 minutes 29 seconds  Total Procedure Duration: 0 hours 12 minutes 4 seconds  Findings:                 The perianal and digital rectal examinations were                            normal.                           A 7 mm polyp was found in the ascending colon. The                            polyp was sessile. The polyp was removed with a                            cold snare. Resection and retrieval were complete.                           Multiple small-mouthed diverticula were found in  the sigmoid colon and descending colon.                           Non-bleeding internal hemorrhoids were found during                            retroflexion. The hemorrhoids were small. Complications:            No immediate complications. Estimated Blood Loss:     Estimated blood loss was minimal. Impression:               - One 7 mm polyp in the ascending colon, removed                            with a cold snare. Resected and retrieved.                           - Diverticulosis in the sigmoid colon and in the                            descending colon.                           - Non-bleeding internal hemorrhoids. Recommendation:           - Patient has a contact number available for                            emergencies. The signs and symptoms of potential                            delayed complications were discussed with the                            patient. Return to normal  activities tomorrow.                            Written discharge instructions were provided to the                            patient.                           - Resume previous diet.                           - Continue present medications.                           - Await pathology results.                           - Repeat colonoscopy in 5-10 years for surveillance                            based on pathology results. Mauri Pole, MD 02/02/2017 11:48:26 AM This report has been signed electronically.

## 2017-02-02 NOTE — Telephone Encounter (Signed)
Patient telephone call returned. She was very nauseated after the second bottle of prep and was concerned if she got sick if it was ok for her to still have the procedure. I reassured her that this does happen. She did complete the prep and is running clear to yellow with her stool. She will be here at 10 am for her procedure. SM

## 2017-02-02 NOTE — Patient Instructions (Signed)
Handout given on polyps and diverticulosis.  YOU HAD AN ENDOSCOPIC PROCEDURE TODAY AT THE [facility]:   Refer to the procedure report that was given to you for any specific questions about what was found during the examination.  If the procedure report does not answer your questions, please call your gastroenterologist to clarify.  If you requested that your care partner not be given the details of your procedure findings, then the procedure report has been included in a sealed envelope for you to review at your convenience later.  YOU SHOULD EXPECT: Some feelings of bloating in the abdomen. Passage of more gas than usual.  Walking can help get rid of the air that was put into your GI tract during the procedure and reduce the bloating. If you had a lower endoscopy (such as a colonoscopy or flexible sigmoidoscopy) you may notice spotting of blood in your stool or on the toilet paper. If you underwent a bowel prep for your procedure, you may not have a normal bowel movement for a few days.  Please Note:  You might notice some irritation and congestion in your nose or some drainage.  This is from the oxygen used during your procedure.  There is no need for concern and it should clear up in a day or so.  SYMPTOMS TO REPORT IMMEDIATELY:   Following lower endoscopy (colonoscopy or flexible sigmoidoscopy):  Excessive amounts of blood in the stool  Significant tenderness or worsening of abdominal pains  Swelling of the abdomen that is new, acute  Fever of 100F or higher  For urgent or emergent issues, a gastroenterologist can be reached at any hour by calling [phone].   DIET:  We do recommend a small meal at first, but then you may proceed to your regular diet.  Drink plenty of fluids but you should avoid alcoholic beverages for 24 hours.  ACTIVITY:  You should plan to take it easy for the rest of today and you should NOT DRIVE or use heavy machinery until tomorrow (because of the  sedation medicines used during the test).    FOLLOW UP: Our staff will call the number listed on your records the next business day following your procedure to check on you and address any questions or concerns that you may have regarding the information given to you following your procedure. If we do not reach you, we will leave a message.  However, if you are feeling well and you are not experiencing any problems, there is no need to return our call.  We will assume that you have returned to your regular daily activities without incident.  If any biopsies were taken you will be contacted by phone or by letter within the next 1-3 weeks.  Please call us at [phone] if you have not heard about the biopsies in 3 weeks.    SIGNATURES/CONFIDENTIALITY: You and/or your care partner have signed paperwork which will be entered into your electronic medical record.  These signatures attest to the fact that that the information above on your After Visit Summary has been reviewed and is understood.  Full responsibility of the confidentiality of this discharge information lies with you and/or your care-partner. 

## 2017-02-02 NOTE — Progress Notes (Signed)
Called to room to assist during endoscopic procedure.  Patient ID and intended procedure confirmed with present staff. Received instructions for my participation in the procedure from the performing physician.  

## 2017-02-02 NOTE — Progress Notes (Signed)
Pt's states no medical or surgical changes since previsit or office visit. Also patient noted with small rash to lower back area. Cream allpied at home.

## 2017-02-02 NOTE — Progress Notes (Signed)
Spontaneous respirations throughout. VSS. Resting comfortably. To PACU on room air. Report to  RN. 

## 2017-02-03 ENCOUNTER — Telehealth: Payer: Self-pay | Admitting: *Deleted

## 2017-02-03 NOTE — Telephone Encounter (Signed)
  Follow up Call-  Call back number 02/02/2017  Post procedure Call Back phone  # [phone]  Permission to leave phone message Yes  Some recent data might be hidden     Patient questions:  Do you have a fever, pain , or abdominal swelling? No. Pain Score  0 *  Have you tolerated food without any problems? Yes.    Have you been able to return to your normal activities? No.  Do you have any questions about your discharge instructions: Diet   No. Medications  No. Follow up visit  No.  Do you have questions or concerns about your Care? No.  Actions: * If pain score is 4 or above: No action needed, pain <4.

## 2017-02-11 ENCOUNTER — Encounter: Payer: Self-pay | Admitting: Gastroenterology

## 2017-05-11 ENCOUNTER — Encounter (INDEPENDENT_AMBULATORY_CARE_PROVIDER_SITE_OTHER): Payer: Medicare Other | Admitting: Ophthalmology

## 2017-05-11 DIAGNOSIS — H43813 Vitreous degeneration, bilateral: Secondary | ICD-10-CM

## 2017-05-11 DIAGNOSIS — H33313 Horseshoe tear of retina without detachment, bilateral: Secondary | ICD-10-CM

## 2017-05-25 ENCOUNTER — Encounter (INDEPENDENT_AMBULATORY_CARE_PROVIDER_SITE_OTHER): Payer: Medicare Other | Admitting: Ophthalmology

## 2017-05-25 DIAGNOSIS — H33301 Unspecified retinal break, right eye: Secondary | ICD-10-CM

## 2017-07-05 ENCOUNTER — Encounter (HOSPITAL_COMMUNITY): Payer: Self-pay | Admitting: *Deleted

## 2017-07-05 ENCOUNTER — Emergency Department (HOSPITAL_COMMUNITY)
Admission: EM | Admit: 2017-07-05 | Discharge: 2017-07-05 | Disposition: A | Discharge status: Home / Self Care | Payer: Medicare Other | Attending: Emergency Medicine | Admitting: Emergency Medicine

## 2017-07-05 ENCOUNTER — Other Ambulatory Visit: Payer: Self-pay

## 2017-07-05 ENCOUNTER — Emergency Department (HOSPITAL_COMMUNITY): Payer: Medicare Other

## 2017-07-05 DIAGNOSIS — S91332A Puncture wound without foreign body, left foot, initial encounter: Secondary | ICD-10-CM | POA: Insufficient documentation

## 2017-07-05 DIAGNOSIS — Z7982 Long term (current) use of aspirin: Secondary | ICD-10-CM | POA: Insufficient documentation

## 2017-07-05 DIAGNOSIS — S59911A Unspecified injury of right forearm, initial encounter: Secondary | ICD-10-CM | POA: Diagnosis present

## 2017-07-05 DIAGNOSIS — W540XXA Bitten by dog, initial encounter: Secondary | ICD-10-CM | POA: Insufficient documentation

## 2017-07-05 DIAGNOSIS — S81031A Puncture wound without foreign body, right knee, initial encounter: Secondary | ICD-10-CM | POA: Insufficient documentation

## 2017-07-05 DIAGNOSIS — F17200 Nicotine dependence, unspecified, uncomplicated: Secondary | ICD-10-CM | POA: Diagnosis not present

## 2017-07-05 DIAGNOSIS — Z79899 Other long term (current) drug therapy: Secondary | ICD-10-CM | POA: Diagnosis not present

## 2017-07-05 DIAGNOSIS — Z23 Encounter for immunization: Secondary | ICD-10-CM | POA: Insufficient documentation

## 2017-07-05 DIAGNOSIS — S61452A Open bite of left hand, initial encounter: Secondary | ICD-10-CM | POA: Diagnosis not present

## 2017-07-05 DIAGNOSIS — S51831A Puncture wound without foreign body of right forearm, initial encounter: Secondary | ICD-10-CM | POA: Insufficient documentation

## 2017-07-05 DIAGNOSIS — Y999 Unspecified external cause status: Secondary | ICD-10-CM | POA: Insufficient documentation

## 2017-07-05 DIAGNOSIS — Y9389 Activity, other specified: Secondary | ICD-10-CM | POA: Insufficient documentation

## 2017-07-05 DIAGNOSIS — Y92018 Other place in single-family (private) house as the place of occurrence of the external cause: Secondary | ICD-10-CM | POA: Insufficient documentation

## 2017-07-05 DIAGNOSIS — T07XXXA Unspecified multiple injuries, initial encounter: Secondary | ICD-10-CM

## 2017-07-05 MED ORDER — ONDANSETRON HCL 4 MG/2ML IJ SOLN
4.0000 mg | Freq: Once | INTRAMUSCULAR | Status: AC
  Administered 2017-07-05: 4 mg via INTRAVENOUS
  Filled 2017-07-05: qty 2

## 2017-07-05 MED ORDER — MORPHINE SULFATE (PF) 4 MG/ML IV SOLN
4.0000 mg | Freq: Once | INTRAVENOUS | Status: AC
  Administered 2017-07-05: 4 mg via INTRAVENOUS
  Filled 2017-07-05: qty 1

## 2017-07-05 MED ORDER — SODIUM CHLORIDE 0.9 % IV SOLN
3.0000 g | Freq: Once | INTRAVENOUS | Status: AC
  Administered 2017-07-05: 3 g via INTRAVENOUS
  Filled 2017-07-05 (×2): qty 3

## 2017-07-05 MED ORDER — DOXYCYCLINE HYCLATE 100 MG PO CAPS: 100.0000 mg | ORAL_CAPSULE | Freq: Two times a day (BID) | ORAL | 0 refills | Status: DC

## 2017-07-05 MED ORDER — TETANUS-DIPHTH-ACELL PERTUSSIS 5-2.5-18.5 LF-MCG/0.5 IM SUSP
0.5000 mL | Freq: Once | INTRAMUSCULAR | Status: AC
  Administered 2017-07-05: 0.5 mL via INTRAMUSCULAR
  Filled 2017-07-05: qty 0.5

## 2017-07-05 MED ORDER — AMOXICILLIN-POT CLAVULANATE 875-125 MG PO TABS: 1.0000 | ORAL_TABLET | Freq: Two times a day (BID) | ORAL | 0 refills | Status: DC

## 2017-07-05 NOTE — ED Notes (Signed)
Pt to Xray.

## 2017-07-05 NOTE — Discharge Instructions (Signed)
Keep wounds covered, clean and dry.  You can shower tomorrow morning, but do not submerge the wounds underwater, no baths or swimming.  Starting tomorrow morning you will need to change dressings twice daily.  You will need to take both antibiotics as directed.  Please complete entire course.  You may use your home pain medications as needed.  You can use ice packs over bandages to help with swelling.  You will need to follow-up with your primary care doctor in 48 hours for a wound check.  If you have any worsening pain, swelling, redness, drainage or warmth around the areas, or develop any fever return to the ED sooner for reevaluation.

## 2017-07-05 NOTE — ED Provider Notes (Signed)
Medical screening examination/treatment/procedure(s) were conducted as a shared visit with non-physician practitioner(s) and myself.  I personally evaluated the patient during the encounter. Briefly, the patient is a 62 yo F here with dog bite to b/l UE and RLE. Dogs vaccinated, pt tetanus updated. She does have some superficial wounds gaping on L hand - will steristrip. Pt very high risk for infection, will give dose of Unasyn, fully irrigate wounds with betadine/saline, and place on empiric broad abx with 48 hour wound check. Return precautions given.   EKG Interpretation None          Duffy Bruce, MD 07/05/17 1245

## 2017-07-05 NOTE — ED Triage Notes (Signed)
Pt in via EMS, multiple dog bites, states she had two dogs at home that started fighting and she tried to break them up, bites to bilateral forearms with some puncture wounds, wound has dressing in place on arrival with bleeding controlled- pt took own percocet prior to EMS arrival, no distress noted, pt tetanus is current and dogs rabies vaccines are UTD.
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
## 2017-09-27 ENCOUNTER — Encounter (INDEPENDENT_AMBULATORY_CARE_PROVIDER_SITE_OTHER): Payer: Medicare Other | Admitting: Ophthalmology

## 2017-10-11 ENCOUNTER — Encounter (INDEPENDENT_AMBULATORY_CARE_PROVIDER_SITE_OTHER): Payer: Medicare Other | Admitting: Ophthalmology

## 2017-10-11 DIAGNOSIS — H43813 Vitreous degeneration, bilateral: Secondary | ICD-10-CM

## 2017-10-11 DIAGNOSIS — H35413 Lattice degeneration of retina, bilateral: Secondary | ICD-10-CM

## 2017-12-10 ENCOUNTER — Other Ambulatory Visit: Payer: Self-pay | Admitting: Family Medicine

## 2017-12-11 ENCOUNTER — Other Ambulatory Visit: Payer: Self-pay | Admitting: Family Medicine

## 2017-12-11 DIAGNOSIS — M542 Cervicalgia: Secondary | ICD-10-CM

## 2017-12-23 ENCOUNTER — Ambulatory Visit
Admission: RE | Admit: 2017-12-23 | Discharge: 2017-12-23 | Disposition: A | Discharge status: Home / Self Care | Payer: Medicare Other | Source: Ambulatory Visit | Attending: Family Medicine | Admitting: Family Medicine

## 2017-12-23 DIAGNOSIS — M542 Cervicalgia: Secondary | ICD-10-CM

## 2017-12-23 MED ORDER — GADOBENATE DIMEGLUMINE 529 MG/ML IV SOLN
11.0000 mL | Freq: Once | INTRAVENOUS | Status: AC | PRN
  Administered 2017-12-23: 11 mL via INTRAVENOUS

## 2019-04-28 ENCOUNTER — Other Ambulatory Visit: Payer: Self-pay | Admitting: Family Medicine

## 2019-04-28 DIAGNOSIS — M5416 Radiculopathy, lumbar region: Secondary | ICD-10-CM

## 2019-05-27 ENCOUNTER — Ambulatory Visit
Admission: RE | Admit: 2019-05-27 | Discharge: 2019-05-27 | Disposition: A | Discharge status: Home / Self Care | Payer: Medicare Other | Source: Ambulatory Visit | Attending: Family Medicine | Admitting: Family Medicine

## 2019-05-27 DIAGNOSIS — M5416 Radiculopathy, lumbar region: Secondary | ICD-10-CM

## 2019-06-07 ENCOUNTER — Other Ambulatory Visit: Payer: Self-pay | Admitting: Neurological Surgery

## 2019-06-27 NOTE — Progress Notes (Addendum)
[place], [place] - [address] [place] [address] Phone: [phone] Fax: [phone]      Your procedure is scheduled on Jul 05, 2019.  Report to [facility] Main Entrance "A" at 6:30 A.M., and check in at the Admitting office.  Call this number if you have problems the morning of surgery:  [phone]  Call [phone] if you have any questions prior to your surgery date Monday-Friday 8am-4pm    Remember:  Do not eat or drink after midnight the night before your surgery    Take these medicines the morning of surgery with A SIP OF WATER: escitalopram (LEXAPRO)  AS NEEDED: ALPRAZolam Duanne Moron) Carboxymeth-Glycerin-Polysorb (REFRESH OPTIVE MEGA-3 OP) oxyCODONE-acetaminophen (PERCOCET)  As of today, STOP taking any Aspirin (unless otherwise instructed by your surgeon) and Aspirin containing products, Aleve, Naproxen, Ibuprofen, Motrin, Advil, Goody's, BC's, all herbal medications, fish oil, and all vitamins.                      Do not wear jewelry, make up, or nail polish            Do not wear lotions, powders, perfumes or deodorant.            Do not shave 48 hours prior to surgery.              Do not bring valuables to the hospital.            [facility] is not responsible for any belongings or valuables.  Do NOT Smoke (Tobacco/Vapping) or drink Alcohol 24 hours prior to your procedure If you use a CPAP at night, you may bring all equipment for your overnight stay.   Contacts, glasses, dentures or bridgework may not be worn into surgery.      For patients admitted to the hospital, discharge time will be determined by your treatment team.   Patients discharged the day of surgery will not be allowed to drive home, and someone needs to stay with them for 24 hours.    Special instructions:   [facility]- Preparing For Surgery  Before surgery, you can play an important role. Because skin is not  sterile, your skin needs to be as free of germs as possible. You can reduce the number of germs on your skin by washing with CHG (chlorahexidine gluconate) Soap before surgery.  CHG is an antiseptic cleaner which kills germs and bonds with the skin to continue killing germs even after washing.    Oral Hygiene is also important to reduce your risk of infection.  Remember - BRUSH YOUR TEETH THE MORNING OF SURGERY WITH YOUR REGULAR TOOTHPASTE  Please do not use if you have an allergy to CHG or antibacterial soaps. If your skin becomes reddened/irritated stop using the CHG.  Do not shave (including legs and underarms) for at least 48 hours prior to first CHG shower. It is OK to shave your face.  Please follow these instructions carefully.   1. Shower the NIGHT BEFORE SURGERY and the MORNING OF SURGERY with CHG Soap.   2. If you chose to wash your hair, wash your hair first as usual with your normal shampoo.  3. After you shampoo, rinse your hair and body thoroughly to remove the shampoo.  4. Use CHG as you would any other liquid soap. You can apply CHG directly to the skin and wash gently with a scrungie or a clean washcloth.  5. Apply the CHG Soap to your body ONLY FROM THE NECK DOWN.  Do not use on open wounds or open sores. Avoid contact with your eyes, ears, mouth and genitals (private parts). Wash Face and genitals (private parts)  with your normal soap.   6. Wash thoroughly, paying special attention to the area where your surgery will be performed.  7. Thoroughly rinse your body with warm water from the neck down.  8. DO NOT shower/wash with your normal soap after using and rinsing off the CHG Soap.  9. Pat yourself dry with a CLEAN TOWEL.  10. Wear CLEAN PAJAMAS to bed the night before surgery, wear comfortable clothes the morning of surgery  11. Place CLEAN SHEETS on your bed the night of your first shower and DO NOT SLEEP WITH PETS.   Day of Surgery:   Do not apply any  deodorants/lotions.  Please wear clean clothes to the hospital/surgery center.   Remember to brush your teeth WITH YOUR REGULAR TOOTHPASTE.   Please read over the following fact sheets that you were given.

## 2019-06-28 ENCOUNTER — Encounter (HOSPITAL_COMMUNITY): Payer: Self-pay

## 2019-06-28 ENCOUNTER — Ambulatory Visit (HOSPITAL_COMMUNITY)
Admission: RE | Admit: 2019-06-28 | Discharge: 2019-06-28 | Disposition: A | Discharge status: Home / Self Care | Payer: Medicare Other | Source: Ambulatory Visit | Attending: Neurological Surgery | Admitting: Neurological Surgery

## 2019-06-28 ENCOUNTER — Other Ambulatory Visit: Payer: Self-pay

## 2019-06-28 ENCOUNTER — Encounter (HOSPITAL_COMMUNITY)
Admission: RE | Admit: 2019-06-28 | Discharge: 2019-06-28 | Disposition: A | Discharge status: Home / Self Care | Payer: Medicare Other | Source: Ambulatory Visit | Attending: Neurological Surgery | Admitting: Neurological Surgery

## 2019-06-28 DIAGNOSIS — Z01818 Encounter for other preprocedural examination: Secondary | ICD-10-CM | POA: Insufficient documentation

## 2019-06-28 DIAGNOSIS — M4310 Spondylolisthesis, site unspecified: Secondary | ICD-10-CM | POA: Insufficient documentation

## 2019-06-28 DIAGNOSIS — R918 Other nonspecific abnormal finding of lung field: Secondary | ICD-10-CM | POA: Insufficient documentation

## 2019-06-28 LAB — CBC WITH DIFFERENTIAL/PLATELET
Abs Immature Granulocytes: 0.04 10*3/uL (ref 0.00–0.07)
Basophils Absolute: 0.1 10*3/uL (ref 0.0–0.1)
Basophils Relative: 1 %
Eosinophils Absolute: 0.2 10*3/uL (ref 0.0–0.5)
Eosinophils Relative: 2 %
HCT: 41.4 % (ref 36.0–46.0)
Hemoglobin: 13.9 g/dL (ref 12.0–15.0)
Immature Granulocytes: 1 %
Lymphocytes Relative: 28 %
Lymphs Abs: 2.5 10*3/uL (ref 0.7–4.0)
MCH: 31.7 pg (ref 26.0–34.0)
MCHC: 33.6 g/dL (ref 30.0–36.0)
MCV: 94.3 fL (ref 80.0–100.0)
Monocytes Absolute: 0.9 10*3/uL (ref 0.1–1.0)
Monocytes Relative: 10 %
Neutro Abs: 5.1 10*3/uL (ref 1.7–7.7)
Neutrophils Relative %: 58 %
Platelets: 243 10*3/uL (ref 150–400)
RBC: 4.39 MIL/uL (ref 3.87–5.11)
RDW: 13.2 % (ref 11.5–15.5)
WBC: 8.7 10*3/uL (ref 4.0–10.5)
nRBC: 0 % (ref 0.0–0.2)

## 2019-06-28 LAB — TYPE AND SCREEN
ABO/RH(D): A POS
Antibody Screen: NEGATIVE

## 2019-06-28 LAB — SURGICAL PCR SCREEN
MRSA, PCR: NEGATIVE
Staphylococcus aureus: NEGATIVE

## 2019-06-28 LAB — BASIC METABOLIC PANEL
Anion gap: 11 (ref 5–15)
BUN: 13 mg/dL (ref 8–23)
CO2: 21 mmol/L — ABNORMAL LOW (ref 22–32)
Calcium: 9.2 mg/dL (ref 8.9–10.3)
Chloride: 105 mmol/L (ref 98–111)
Creatinine, Ser: 0.68 mg/dL (ref 0.44–1.00)
GFR calc Af Amer: >60 mL/min (ref >60)
GFR calc non Af Amer: >60 mL/min (ref >60)
Glucose, Bld: 97 mg/dL (ref 70–99)
Potassium: 4.3 mmol/L (ref 3.5–5.1)
Sodium: 137 mmol/L (ref 135–145)

## 2019-06-28 LAB — ABO/RH: ABO/RH(D): A POS

## 2019-06-28 LAB — PROTIME-INR
INR: 0.9 (ref 0.8–1.2)
Prothrombin Time: 12.2 seconds (ref 11.4–15.2)

## 2019-06-28 NOTE — Progress Notes (Signed)
PCP - Shirline Frees, MD Cardiologist - Denies  PPM/ICD - Denies  Chest x-ray - 06/28/19 EKG - 06/28/19 Stress Test - Denies ECHO - Denies Cardiac Cath - Denies  Sleep Study - Denies  Patient denies being a diabetic.  Blood Thinner Instructions: N/A Aspirin Instructions: N/A  ERAS Protcol - N/A PRE-SURGERY Ensure or G2- N/A  COVID TEST- 07/01/19   Anesthesia review: No  Patient denies shortness of breath, fever, cough and chest pain at PAT appointment   All instructions explained to the patient, with a verbal understanding of the material. Patient agrees to go over the instructions while at home for a better understanding. Patient also instructed to self quarantine after being tested for COVID-19. The opportunity to ask questions was provided.  \

## 2019-07-01 ENCOUNTER — Other Ambulatory Visit (HOSPITAL_COMMUNITY)
Admission: RE | Admit: 2019-07-01 | Discharge: 2019-07-01 | Disposition: A | Discharge status: Home / Self Care | Payer: Medicare Other | Source: Ambulatory Visit | Attending: Neurological Surgery | Admitting: Neurological Surgery

## 2019-07-01 DIAGNOSIS — Z01812 Encounter for preprocedural laboratory examination: Secondary | ICD-10-CM | POA: Insufficient documentation

## 2019-07-01 DIAGNOSIS — Z20822 Contact with and (suspected) exposure to covid-19: Secondary | ICD-10-CM | POA: Insufficient documentation

## 2019-07-01 LAB — SARS CORONAVIRUS 2 (TAT 6-24 HRS): SARS Coronavirus 2: NEGATIVE

## 2019-07-04 NOTE — Anesthesia Preprocedure Evaluation (Addendum)
Anesthesia Evaluation  Patient identified by MRN, date of birth, ID band Patient awake    Reviewed: Allergy & Precautions, NPO status , Patient's Chart, lab work & pertinent test results  History of Anesthesia Complications Negative for: history of anesthetic complications  Airway Mallampati: II  TM Distance: >3 FB Neck ROM: Full    Dental  (+) Teeth Intact, Dental Advisory Given Lower left bridge:   Pulmonary former smoker,    Pulmonary exam normal breath sounds clear to auscultation       Cardiovascular Exercise Tolerance: Good negative cardio ROS Normal cardiovascular exam Rhythm:Regular Rate:Normal     Neuro/Psych PSYCHIATRIC DISORDERS Anxiety Depression Spondylolisthesis    GI/Hepatic Neg liver ROS, GERD  ,  Endo/Other  negative endocrine ROS  Renal/GU negative Renal ROS     Musculoskeletal  (+) Arthritis ,   Abdominal   Peds  Hematology negative hematology ROS (+)   Anesthesia Other Findings   Reproductive/Obstetrics                            Anesthesia Physical Anesthesia Plan  ASA: II  Anesthesia Plan: General   Post-op Pain Management:    Induction: Intravenous  PONV Risk Score and Plan: 3 and Midazolam, Dexamethasone and Ondansetron  Airway Management Planned: Oral ETT  Additional Equipment:   Intra-op Plan:   Post-operative Plan: Extubation in OR  Informed Consent: I have reviewed the patients History and Physical, chart, labs and discussed the procedure including the risks, benefits and alternatives for the proposed anesthesia with the patient or authorized representative who has indicated his/her understanding and acceptance.     Dental advisory given  Plan Discussed with: CRNA  Anesthesia Plan Comments:        Anesthesia Quick Evaluation

## 2019-07-05 ENCOUNTER — Encounter (HOSPITAL_COMMUNITY)
Admission: RE | Disposition: A | Discharge status: Home / Self Care | Payer: Self-pay | Source: Home / Self Care | Attending: Neurological Surgery

## 2019-07-05 ENCOUNTER — Other Ambulatory Visit: Payer: Self-pay

## 2019-07-05 ENCOUNTER — Encounter (HOSPITAL_COMMUNITY): Payer: Self-pay | Admitting: Neurological Surgery

## 2019-07-05 ENCOUNTER — Inpatient Hospital Stay (HOSPITAL_COMMUNITY): Payer: Medicare Other

## 2019-07-05 ENCOUNTER — Inpatient Hospital Stay (HOSPITAL_COMMUNITY)
Admission: RE | Admit: 2019-07-05 | Discharge: 2019-07-06 | DRG: 455 | Disposition: A | Discharge status: Home / Self Care | Payer: Medicare Other | Attending: Neurological Surgery | Admitting: Neurological Surgery

## 2019-07-05 ENCOUNTER — Inpatient Hospital Stay (HOSPITAL_COMMUNITY): Payer: Medicare Other | Admitting: Anesthesiology

## 2019-07-05 DIAGNOSIS — Z79899 Other long term (current) drug therapy: Secondary | ICD-10-CM

## 2019-07-05 DIAGNOSIS — Z7982 Long term (current) use of aspirin: Secondary | ICD-10-CM

## 2019-07-05 DIAGNOSIS — Z87891 Personal history of nicotine dependence: Secondary | ICD-10-CM | POA: Diagnosis not present

## 2019-07-05 DIAGNOSIS — G8929 Other chronic pain: Secondary | ICD-10-CM | POA: Diagnosis not present

## 2019-07-05 DIAGNOSIS — Z888 Allergy status to other drugs, medicaments and biological substances status: Secondary | ICD-10-CM | POA: Diagnosis not present

## 2019-07-05 DIAGNOSIS — Z981 Arthrodesis status: Secondary | ICD-10-CM

## 2019-07-05 DIAGNOSIS — M542 Cervicalgia: Secondary | ICD-10-CM | POA: Diagnosis not present

## 2019-07-05 DIAGNOSIS — Z9049 Acquired absence of other specified parts of digestive tract: Secondary | ICD-10-CM

## 2019-07-05 DIAGNOSIS — Z885 Allergy status to narcotic agent status: Secondary | ICD-10-CM

## 2019-07-05 DIAGNOSIS — Z419 Encounter for procedure for purposes other than remedying health state, unspecified: Secondary | ICD-10-CM

## 2019-07-05 DIAGNOSIS — Z9842 Cataract extraction status, left eye: Secondary | ICD-10-CM | POA: Diagnosis not present

## 2019-07-05 DIAGNOSIS — F329 Major depressive disorder, single episode, unspecified: Secondary | ICD-10-CM | POA: Diagnosis not present

## 2019-07-05 DIAGNOSIS — Z79891 Long term (current) use of opiate analgesic: Secondary | ICD-10-CM

## 2019-07-05 DIAGNOSIS — Z90710 Acquired absence of both cervix and uterus: Secondary | ICD-10-CM

## 2019-07-05 DIAGNOSIS — Z9841 Cataract extraction status, right eye: Secondary | ICD-10-CM | POA: Diagnosis not present

## 2019-07-05 DIAGNOSIS — M549 Dorsalgia, unspecified: Secondary | ICD-10-CM | POA: Diagnosis present

## 2019-07-05 DIAGNOSIS — F419 Anxiety disorder, unspecified: Secondary | ICD-10-CM | POA: Diagnosis not present

## 2019-07-05 DIAGNOSIS — M48061 Spinal stenosis, lumbar region without neurogenic claudication: Secondary | ICD-10-CM | POA: Diagnosis not present

## 2019-07-05 DIAGNOSIS — K219 Gastro-esophageal reflux disease without esophagitis: Secondary | ICD-10-CM | POA: Diagnosis present

## 2019-07-05 DIAGNOSIS — M5126 Other intervertebral disc displacement, lumbar region: Secondary | ICD-10-CM | POA: Diagnosis present

## 2019-07-05 DIAGNOSIS — M4316 Spondylolisthesis, lumbar region: Secondary | ICD-10-CM | POA: Diagnosis present

## 2019-07-05 HISTORY — PX: LUMBAR LAMINECTOMY/DECOMPRESSION MICRODISCECTOMY: SHX5026

## 2019-07-05 SURGERY — POSTERIOR LUMBAR FUSION 1 LEVEL
Anesthesia: General | Site: Back

## 2019-07-05 MED ORDER — BUPIVACAINE HCL (PF) 0.25 % IJ SOLN
INTRAMUSCULAR | Status: DC | PRN
  Administered 2019-07-05: 10 mL

## 2019-07-05 MED ORDER — ONDANSETRON HCL 4 MG PO TABS: 4.0000 mg | ORAL_TABLET | Freq: Four times a day (QID) | ORAL | Status: DC | PRN

## 2019-07-05 MED ORDER — THROMBIN 20000 UNITS EX SOLR: CUTANEOUS | Status: DC | PRN

## 2019-07-05 MED ORDER — ONDANSETRON HCL 4 MG/2ML IJ SOLN: 4.0000 mg | Freq: Four times a day (QID) | INTRAMUSCULAR | Status: DC | PRN

## 2019-07-05 MED ORDER — ONDANSETRON HCL 4 MG/2ML IJ SOLN
INTRAMUSCULAR | Status: AC
  Filled 2019-07-05: qty 2

## 2019-07-05 MED ORDER — ACETAMINOPHEN 650 MG RE SUPP: 650.0000 mg | RECTAL | Status: DC | PRN

## 2019-07-05 MED ORDER — THROMBIN 5000 UNITS EX SOLR
CUTANEOUS | Status: AC
  Filled 2019-07-05: qty 5000

## 2019-07-05 MED ORDER — ONDANSETRON HCL 4 MG/2ML IJ SOLN
INTRAMUSCULAR | Status: DC | PRN
  Administered 2019-07-05: 4 mg via INTRAVENOUS

## 2019-07-05 MED ORDER — METHOCARBAMOL 500 MG PO TABS
ORAL_TABLET | ORAL | Status: AC
  Filled 2019-07-05: qty 1

## 2019-07-05 MED ORDER — SODIUM CHLORIDE 0.9% FLUSH: 3.0000 mL | INTRAVENOUS | Status: DC | PRN

## 2019-07-05 MED ORDER — DEXAMETHASONE SODIUM PHOSPHATE 10 MG/ML IJ SOLN
10.0000 mg | Freq: Once | INTRAMUSCULAR | Status: DC
  Filled 2019-07-05: qty 1

## 2019-07-05 MED ORDER — MIDAZOLAM HCL 2 MG/2ML IJ SOLN
INTRAMUSCULAR | Status: AC
  Filled 2019-07-05: qty 2

## 2019-07-05 MED ORDER — FENTANYL CITRATE (PF) 100 MCG/2ML IJ SOLN
25.0000 ug | INTRAMUSCULAR | Status: DC | PRN
  Administered 2019-07-05 (×2): 50 ug via INTRAVENOUS

## 2019-07-05 MED ORDER — PHENYLEPHRINE HCL-NACL 10-0.9 MG/250ML-% IV SOLN
INTRAVENOUS | Status: DC | PRN
  Administered 2019-07-05: 30 ug/min via INTRAVENOUS

## 2019-07-05 MED ORDER — ARTIFICIAL TEARS OPHTHALMIC OINT
TOPICAL_OINTMENT | OPHTHALMIC | Status: DC | PRN
  Administered 2019-07-05: 1 via OPHTHALMIC

## 2019-07-05 MED ORDER — ROCURONIUM BROMIDE 10 MG/ML (PF) SYRINGE
PREFILLED_SYRINGE | INTRAVENOUS | Status: DC | PRN
  Administered 2019-07-05: 50 mg via INTRAVENOUS
  Administered 2019-07-05: 30 mg via INTRAVENOUS
  Administered 2019-07-05: 50 mg via INTRAVENOUS

## 2019-07-05 MED ORDER — MIDAZOLAM HCL 5 MG/5ML IJ SOLN
INTRAMUSCULAR | Status: DC | PRN
  Administered 2019-07-05: 2 mg via INTRAVENOUS

## 2019-07-05 MED ORDER — HYDROMORPHONE HCL 1 MG/ML IJ SOLN: 0.5000 mg | INTRAMUSCULAR | Status: DC | PRN

## 2019-07-05 MED ORDER — FENTANYL CITRATE (PF) 100 MCG/2ML IJ SOLN
INTRAMUSCULAR | Status: AC
  Filled 2019-07-05: qty 2

## 2019-07-05 MED ORDER — MORPHINE SULFATE (PF) 2 MG/ML IV SOLN
2.0000 mg | INTRAVENOUS | Status: DC | PRN
  Administered 2019-07-05: 2 mg via INTRAVENOUS
  Filled 2019-07-05: qty 1

## 2019-07-05 MED ORDER — SODIUM CHLORIDE 0.9 % IV SOLN: 250.0000 mL | INTRAVENOUS | Status: DC

## 2019-07-05 MED ORDER — CELECOXIB 200 MG PO CAPS
200.0000 mg | ORAL_CAPSULE | Freq: Two times a day (BID) | ORAL | Status: DC
  Administered 2019-07-05 – 2019-07-06 (×3): 200 mg via ORAL
  Filled 2019-07-05 (×3): qty 1

## 2019-07-05 MED ORDER — EPHEDRINE SULFATE-NACL 50-0.9 MG/10ML-% IV SOSY
PREFILLED_SYRINGE | INTRAVENOUS | Status: DC | PRN
  Administered 2019-07-05: 5 mg via INTRAVENOUS

## 2019-07-05 MED ORDER — ESCITALOPRAM OXALATE 20 MG PO TABS
20.0000 mg | ORAL_TABLET | Freq: Every day | ORAL | Status: DC
  Administered 2019-07-06: 20 mg via ORAL
  Filled 2019-07-05: qty 1

## 2019-07-05 MED ORDER — 0.9 % SODIUM CHLORIDE (POUR BTL) OPTIME
TOPICAL | Status: DC | PRN
  Administered 2019-07-05: 1000 mL

## 2019-07-05 MED ORDER — LACTATED RINGERS IV SOLN
INTRAVENOUS | Status: DC | PRN
Start: 2019-07-05 — End: 2019-07-05

## 2019-07-05 MED ORDER — SUGAMMADEX SODIUM 200 MG/2ML IV SOLN
INTRAVENOUS | Status: DC | PRN
  Administered 2019-07-05: 100 mg via INTRAVENOUS

## 2019-07-05 MED ORDER — SUCCINYLCHOLINE CHLORIDE 200 MG/10ML IV SOSY
PREFILLED_SYRINGE | INTRAVENOUS | Status: DC | PRN
  Administered 2019-07-05: 100 mg via INTRAVENOUS

## 2019-07-05 MED ORDER — NEURIVA PO CAPS: 1.0000 | ORAL_CAPSULE | Freq: Every day | ORAL | Status: DC

## 2019-07-05 MED ORDER — OXYCODONE HCL 5 MG PO TABS
10.0000 mg | ORAL_TABLET | ORAL | Status: DC | PRN
  Administered 2019-07-05 – 2019-07-06 (×6): 10 mg via ORAL
  Filled 2019-07-05 (×6): qty 2

## 2019-07-05 MED ORDER — GABAPENTIN 100 MG PO CAPS: 100.0000 mg | ORAL_CAPSULE | Freq: Every evening | ORAL | Status: DC | PRN

## 2019-07-05 MED ORDER — DEXAMETHASONE SODIUM PHOSPHATE 10 MG/ML IJ SOLN
INTRAMUSCULAR | Status: DC | PRN
  Administered 2019-07-05: 10 mg via INTRAVENOUS

## 2019-07-05 MED ORDER — PROPOFOL 10 MG/ML IV BOLUS
INTRAVENOUS | Status: AC
  Filled 2019-07-05: qty 40

## 2019-07-05 MED ORDER — ACETAMINOPHEN 500 MG PO TABS
1000.0000 mg | ORAL_TABLET | Freq: Once | ORAL | Status: AC
  Administered 2019-07-05: 1000 mg via ORAL
  Filled 2019-07-05: qty 2

## 2019-07-05 MED ORDER — CEFAZOLIN SODIUM-DEXTROSE 2-4 GM/100ML-% IV SOLN
2.0000 g | Freq: Three times a day (TID) | INTRAVENOUS | Status: AC
  Administered 2019-07-05 – 2019-07-06 (×2): 2 g via INTRAVENOUS
  Filled 2019-07-05 (×2): qty 100

## 2019-07-05 MED ORDER — CHLORHEXIDINE GLUCONATE CLOTH 2 % EX PADS: 6.0000 | MEDICATED_PAD | Freq: Once | CUTANEOUS | Status: DC

## 2019-07-05 MED ORDER — POTASSIUM CHLORIDE IN NACL 20-0.9 MEQ/L-% IV SOLN: INTRAVENOUS | Status: DC

## 2019-07-05 MED ORDER — FENTANYL CITRATE (PF) 250 MCG/5ML IJ SOLN
INTRAMUSCULAR | Status: AC
  Filled 2019-07-05: qty 5

## 2019-07-05 MED ORDER — SODIUM CHLORIDE 0.9 % IV SOLN: INTRAVENOUS | Status: DC | PRN

## 2019-07-05 MED ORDER — ACETAMINOPHEN 325 MG PO TABS
650.0000 mg | ORAL_TABLET | ORAL | Status: DC | PRN
  Administered 2019-07-06: 650 mg via ORAL
  Filled 2019-07-05: qty 2

## 2019-07-05 MED ORDER — MENTHOL 3 MG MT LOZG: 1.0000 | LOZENGE | OROMUCOSAL | Status: DC | PRN

## 2019-07-05 MED ORDER — METHOCARBAMOL 500 MG PO TABS
500.0000 mg | ORAL_TABLET | Freq: Four times a day (QID) | ORAL | Status: DC | PRN
  Administered 2019-07-05: 500 mg via ORAL

## 2019-07-05 MED ORDER — THROMBIN 5000 UNITS EX SOLR: OROMUCOSAL | Status: DC | PRN

## 2019-07-05 MED ORDER — THROMBIN 20000 UNITS EX KIT
PACK | CUTANEOUS | Status: AC
  Filled 2019-07-05: qty 1

## 2019-07-05 MED ORDER — ADULT MULTIVITAMIN W/MINERALS CH
1.0000 | ORAL_TABLET | Freq: Every day | ORAL | Status: DC
  Administered 2019-07-05 – 2019-07-06 (×2): 1 via ORAL
  Filled 2019-07-05 (×2): qty 1

## 2019-07-05 MED ORDER — CEFAZOLIN SODIUM-DEXTROSE 2-4 GM/100ML-% IV SOLN
2.0000 g | INTRAVENOUS | Status: AC
  Administered 2019-07-05: 2 g via INTRAVENOUS
  Filled 2019-07-05: qty 100

## 2019-07-05 MED ORDER — PROPOFOL 10 MG/ML IV BOLUS
INTRAVENOUS | Status: DC | PRN
  Administered 2019-07-05: 150 mg via INTRAVENOUS

## 2019-07-05 MED ORDER — SCOPOLAMINE 1 MG/3DAYS TD PT72
MEDICATED_PATCH | TRANSDERMAL | Status: AC
  Filled 2019-07-05: qty 1

## 2019-07-05 MED ORDER — PHENOL 1.4 % MT LIQD: 1.0000 | OROMUCOSAL | Status: DC | PRN

## 2019-07-05 MED ORDER — LIDOCAINE 2% (20 MG/ML) 5 ML SYRINGE
INTRAMUSCULAR | Status: DC | PRN
  Administered 2019-07-05: 60 mg via INTRAVENOUS

## 2019-07-05 MED ORDER — BUPIVACAINE HCL (PF) 0.25 % IJ SOLN
INTRAMUSCULAR | Status: AC
  Filled 2019-07-05: qty 30

## 2019-07-05 MED ORDER — SENNA 8.6 MG PO TABS
1.0000 | ORAL_TABLET | Freq: Two times a day (BID) | ORAL | Status: DC
  Administered 2019-07-05 – 2019-07-06 (×3): 8.6 mg via ORAL
  Filled 2019-07-05 (×3): qty 1

## 2019-07-05 MED ORDER — DIAZEPAM 5 MG PO TABS
5.0000 mg | ORAL_TABLET | Freq: Four times a day (QID) | ORAL | Status: DC | PRN
  Administered 2019-07-05 – 2019-07-06 (×2): 5 mg via ORAL
  Filled 2019-07-05 (×2): qty 1

## 2019-07-05 MED ORDER — PROMETHAZINE HCL 25 MG/ML IJ SOLN: 6.2500 mg | INTRAMUSCULAR | Status: DC | PRN

## 2019-07-05 MED ORDER — FENTANYL CITRATE (PF) 250 MCG/5ML IJ SOLN
INTRAMUSCULAR | Status: DC | PRN
  Administered 2019-07-05 (×2): 50 ug via INTRAVENOUS
  Administered 2019-07-05: 25 ug via INTRAVENOUS
  Administered 2019-07-05 (×2): 50 ug via INTRAVENOUS
  Administered 2019-07-05: 25 ug via INTRAVENOUS

## 2019-07-05 MED ORDER — SODIUM CHLORIDE 0.9% FLUSH
3.0000 mL | Freq: Two times a day (BID) | INTRAVENOUS | Status: DC
  Administered 2019-07-05: 3 mL via INTRAVENOUS

## 2019-07-05 MED ORDER — METHOCARBAMOL 1000 MG/10ML IJ SOLN
500.0000 mg | Freq: Four times a day (QID) | INTRAVENOUS | Status: DC | PRN
  Filled 2019-07-05: qty 5

## 2019-07-05 SURGICAL SUPPLY — 76 items
ADH SKN CLS APL DERMABOND .7 (GAUZE/BANDAGES/DRESSINGS) ×1
APL SKNCLS STERI-STRIP NONHPOA (GAUZE/BANDAGES/DRESSINGS) ×1
BAG DECANTER FOR FLEXI CONT (MISCELLANEOUS) ×4 IMPLANT
BAND INSRT 18 STRL LF DISP RB (MISCELLANEOUS)
BAND RUBBER #18 3X1/16 STRL (MISCELLANEOUS) ×2 IMPLANT
BASKET BONE COLLECTION (BASKET) ×3 IMPLANT
BENZOIN TINCTURE PRP APPL 2/3 (GAUZE/BANDAGES/DRESSINGS) ×4 IMPLANT
BUR CARBIDE MATCH 3.0 (BURR) ×4 IMPLANT
CANISTER SUCT 3000ML PPV (MISCELLANEOUS) ×6 IMPLANT
CARTRIDGE OIL MAESTRO DRILL (MISCELLANEOUS) ×2 IMPLANT
CLOSURE WOUND 1/2 X4 (GAUZE/BANDAGES/DRESSINGS) ×2
CNTNR URN SCR LID CUP LEK RST (MISCELLANEOUS) ×1 IMPLANT
CONT SPEC 4OZ STRL OR WHT (MISCELLANEOUS) ×3
COVER BACK TABLE 60X90IN (DRAPES) ×3 IMPLANT
DERMABOND ADVANCED (GAUZE/BANDAGES/DRESSINGS) ×2
DERMABOND ADVANCED .7 DNX12 (GAUZE/BANDAGES/DRESSINGS) ×1 IMPLANT
DIFFUSER DRILL AIR PNEUMATIC (MISCELLANEOUS) ×2 IMPLANT
DRAPE C-ARM 42X72 X-RAY (DRAPES) ×10 IMPLANT
DRAPE LAPAROTOMY 100X72X124 (DRAPES) ×4 IMPLANT
DRAPE MICROSCOPE LEICA (MISCELLANEOUS) ×1 IMPLANT
DRAPE SURG 17X23 STRL (DRAPES) ×4 IMPLANT
DURAPREP 26ML APPLICATOR (WOUND CARE) ×4 IMPLANT
ELECT REM PT RETURN 9FT ADLT (ELECTROSURGICAL) ×3
ELECTRODE REM PT RTRN 9FT ADLT (ELECTROSURGICAL) ×2 IMPLANT
EVACUATOR 1/8 PVC DRAIN (DRAIN) ×1 IMPLANT
GAUZE 4X4 16PLY RFD (DISPOSABLE) IMPLANT
GLOVE BIO SURGEON STRL SZ7 (GLOVE) ×4 IMPLANT
GLOVE BIO SURGEON STRL SZ7.5 (GLOVE) ×2 IMPLANT
GLOVE BIO SURGEON STRL SZ8 (GLOVE) ×7 IMPLANT
GLOVE BIOGEL PI IND STRL 6.5 (GLOVE) IMPLANT
GLOVE BIOGEL PI IND STRL 7.0 (GLOVE) IMPLANT
GLOVE BIOGEL PI IND STRL 7.5 (GLOVE) IMPLANT
GLOVE BIOGEL PI INDICATOR 6.5 (GLOVE) ×8
GLOVE BIOGEL PI INDICATOR 7.0 (GLOVE) ×4
GLOVE BIOGEL PI INDICATOR 7.5 (GLOVE) ×4
GLOVE SS N UNI LF 7.0 STRL (GLOVE) ×2 IMPLANT
GLOVE SURG SS PI 6.0 STRL IVOR (GLOVE) ×8 IMPLANT
GOWN STRL REUS W/ TWL LRG LVL3 (GOWN DISPOSABLE) IMPLANT
GOWN STRL REUS W/ TWL XL LVL3 (GOWN DISPOSABLE) ×3 IMPLANT
GOWN STRL REUS W/TWL 2XL LVL3 (GOWN DISPOSABLE) IMPLANT
GOWN STRL REUS W/TWL LRG LVL3 (GOWN DISPOSABLE) ×12
GOWN STRL REUS W/TWL XL LVL3 (GOWN DISPOSABLE) ×9
HEMOSTAT POWDER KIT SURGIFOAM (HEMOSTASIS) ×2 IMPLANT
KIT BASIN OR (CUSTOM PROCEDURE TRAY) ×4 IMPLANT
KIT BONE MRW ASP ANGEL CPRP (KITS) IMPLANT
KIT TURNOVER KIT B (KITS) ×4 IMPLANT
MATRIX SPINE STRIP NEOCORE 5CC (Putty) IMPLANT
MILL MEDIUM DISP (BLADE) ×1 IMPLANT
NDL HYPO 25X1 1.5 SAFETY (NEEDLE) ×2 IMPLANT
NDL SPNL 20GX3.5 QUINCKE YW (NEEDLE) IMPLANT
NEEDLE HYPO 25X1 1.5 SAFETY (NEEDLE) ×3 IMPLANT
NEEDLE SPNL 20GX3.5 QUINCKE YW (NEEDLE) ×3 IMPLANT
NS IRRIG 1000ML POUR BTL (IV SOLUTION) ×6 IMPLANT
OIL CARTRIDGE MAESTRO DRILL (MISCELLANEOUS)
PACK LAMINECTOMY NEURO (CUSTOM PROCEDURE TRAY) ×4 IMPLANT
PAD ARMBOARD 7.5X6 YLW CONV (MISCELLANEOUS) ×14 IMPLANT
ROD LORD LIPPED TI 5.5X35 (Rod) ×4 IMPLANT
SCREW CANC SHANK MOD 5.5X45 (Screw) ×4 IMPLANT
SCREW POLYAXIAL TULIP (Screw) ×8 IMPLANT
SCREW SHANK MOD 5.5X40 (Screw) ×4 IMPLANT
SET SCREW (Screw) ×12 IMPLANT
SET SCREW SPNE (Screw) IMPLANT
SPACER IDENTITI PS 10X9X25 10D (Spacer) ×4 IMPLANT
SPONGE LAP 4X18 RFD (DISPOSABLE) IMPLANT
SPONGE SURGIFOAM ABS GEL 100 (HEMOSTASIS) ×3 IMPLANT
SPONGE SURGIFOAM ABS GEL SZ50 (HEMOSTASIS) IMPLANT
STRIP CLOSURE SKIN 1/2X4 (GAUZE/BANDAGES/DRESSINGS) ×5 IMPLANT
STRIP MATRIX NEOCORE 5CC (Putty) ×2 IMPLANT
SUT VIC AB 0 CT1 18XCR BRD8 (SUTURE) ×2 IMPLANT
SUT VIC AB 0 CT1 8-18 (SUTURE) ×6
SUT VIC AB 2-0 CP2 18 (SUTURE) ×6 IMPLANT
SUT VIC AB 3-0 SH 8-18 (SUTURE) ×7 IMPLANT
TOWEL GREEN STERILE (TOWEL DISPOSABLE) ×4 IMPLANT
TOWEL GREEN STERILE FF (TOWEL DISPOSABLE) ×4 IMPLANT
TRAY FOLEY MTR SLVR 16FR STAT (SET/KITS/TRAYS/PACK) ×3 IMPLANT
WATER STERILE IRR 1000ML POUR (IV SOLUTION) ×4 IMPLANT

## 2019-07-05 NOTE — Anesthesia Procedure Notes (Signed)
Procedure Name: Intubation Date/Time: 07/05/2019 8:31 AM Performed by: Griffin Dakin, CRNA Pre-anesthesia Checklist: Patient identified, Emergency Drugs available, Suction available and Patient being monitored Patient Re-evaluated:Patient Re-evaluated prior to induction Oxygen Delivery Method: Circle system utilized Preoxygenation: Pre-oxygenation with 100% oxygen Induction Type: IV induction and Rapid sequence Laryngoscope Size: Mac and 3 Grade View: Grade II Tube type: Oral Tube size: 7.0 mm Number of attempts: 1 Airway Equipment and Method: Stylet Placement Confirmation: ETT inserted through vocal cords under direct vision,  positive ETCO2 and breath sounds checked- equal and bilateral Secured at: 22 cm Tube secured with: Tape Dental Injury: Teeth and Oropharynx as per pre-operative assessment

## 2019-07-05 NOTE — H&P (Signed)
Subjective: Patient is a 64 y.o. female admitted for back and leg pain. Onset of symptoms was several months ago, gradually worsening since that time.  The pain is rated severe, and is located at the across the lower back and radiates to legs. The pain is described as aching and occurs all day. The symptoms have been progressive. Symptoms are exacerbated by exercise. 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 Anxiety   . Arthritis   . Cataract   . Chronic neck and back pain   . Depression   . GERD (gastroesophageal reflux disease)     Past Surgical History:  Procedure Laterality Date  . ABDOMINAL HYSTERECTOMY    . APPENDECTOMY    . BACK SURGERY     cervical sx. by Dr. Sherley Bounds  . BREAST SURGERY     bilateral breast reduction  . CERVICAL SPINE SURGERY    . CHOLECYSTECTOMY    . EYE SURGERY Bilateral    laser; cataract  . FRACTURE SURGERY Left    wrist  . left shoulder surgery    . TONSILLECTOMY    . WRIST SURGERY      Prior to Admission medications   Medication Sig Start Date End Date Taking? Authorizing Provider  ALPRAZolam Duanne Moron) 1 MG tablet Take 1 mg by mouth 3 (three) times daily as needed for anxiety.    Yes [provider]  APPLE CIDER VINEGAR PO Take 450 mg by mouth 2 (two) times daily.   Yes [provider]  aspirin-sod bicarb-citric acid (ALKA-SELTZER) 325 MG TBEF tablet Take 650 mg by mouth daily as needed (indigestion).   Yes [provider]  Carboxymeth-Glycerin-Polysorb (REFRESH OPTIVE MEGA-3 OP) Place 1 drop into both eyes 5 (five) times daily as needed (dry eyes).   Yes [provider]  Cholecalciferol (DIALYVITE VITAMIN D 5000) 125 MCG (5000 UT) capsule Take 5,000 Units by mouth in the morning and at bedtime.   Yes [provider]  cyclobenzaprine (FLEXERIL) 10 MG tablet Take 10 mg by mouth at bedtime as needed for muscle spasms.   Yes [provider]  escitalopram (LEXAPRO) 20 MG tablet Take 20 mg by mouth daily. 07/02/17  Yes [provider]  Flaxseed, Linseed, (FLAX SEED OIL) 1000 MG CAPS Take 1,000 mg by mouth daily.   Yes [provider]  gabapentin (NEURONTIN) 100 MG capsule Take 100 mg by mouth at bedtime as needed (back pain).   Yes [provider]  Misc Natural Products (NEURIVA) CAPS Take 1 capsule by mouth daily.   Yes [provider]  Multiple Vitamin (MULTIVITAMIN WITH MINERALS) TABS tablet Take 1 tablet by mouth daily. Centrum   Yes [provider]  nabumetone (RELAFEN) 500 MG tablet Take 500 mg by mouth 2 (two) times daily as needed for moderate pain.   Yes [provider]  oxyCODONE-acetaminophen (PERCOCET) 10-325 MG per tablet Take 0.5-1 tablets by mouth 3 (three) times daily as needed for pain.    Yes [provider]  Probiotic Product (ALIGN) 4 MG CAPS Take 4 mg by mouth daily.   Yes [provider]  Turmeric 500 MG CAPS Take 500 mg by mouth 2 (two) times daily.   Yes [provider]   Allergies  Allergen Reactions  . Codeine Nausea And Vomiting  . Sertraline Other (See Comments)    Confusion   . Prednisone Anxiety    Jittery  Social History   Tobacco Use  . Smoking status: Current Every Day Smoker  . Smokeless tobacco: Never Used  . Tobacco comment: Quit in 2015  Substance Use Topics  . Alcohol use: Not on file    Comment: occ wine and beer.     Family History  Problem Relation Age of Onset  . Colon cancer Neg Hx   . Esophageal cancer Neg Hx   . Pancreatic cancer Neg Hx   . Rectal cancer Neg Hx   . Stomach cancer Neg Hx      Review of Systems  Positive ROS: neg  All other systems have been reviewed and were otherwise negative with the exception of those mentioned in the HPI and as above.  Objective: Vital signs in last 24 hours: Temp:  [98.1 F (36.7 C)] 98.1 F (36.7 C) (05/19 [id number]) Pulse Rate:   [73] 73 (05/19 0638) Resp:  [17] 17 (05/19 [id number]) BP: (149)/(74) 149/74 (05/19 [id number]) SpO2:  [98 %] 98 % (05/19 [id number]) Weight:  [58.5 kg] 58.5 kg (05/19 [id number])  General Appearance: Alert, cooperative, no distress, appears stated age Head: Normocephalic, without obvious abnormality, atraumatic Eyes: PERRL, conjunctiva/corneas clear, EOM's intact    Neck: Supple, symmetrical, trachea midline Back: Symmetric, no curvature, ROM normal, no CVA tenderness Lungs:  respirations unlabored Heart: Regular rate and rhythm Abdomen: Soft, non-tender Extremities: Extremities normal, atraumatic, no cyanosis or edema Pulses: 2+ and symmetric all extremities Skin: Skin color, texture, turgor normal, no rashes or lesions  NEUROLOGIC:   Mental status: Alert and oriented x4,  no aphasia, good attention span, fund of knowledge, and memory Motor Exam - grossly normal Sensory Exam - grossly normal Reflexes: 1+ Coordination - grossly normal Gait - grossly normal Balance - grossly normal Cranial Nerves: I: smell Not tested  II: visual acuity  OS: nl    OD: nl  II: visual fields Full to confrontation  II: pupils Equal, round, reactive to light  III,VII: ptosis None  III,IV,VI: extraocular muscles  Full ROM  V: mastication Normal  V: facial light touch sensation  Normal  V,VII: corneal reflex  Present  VII: facial muscle function - upper  Normal  VII: facial muscle function - lower Normal  VIII: hearing Not tested  IX: soft palate elevation  Normal  IX,X: gag reflex Present  XI: trapezius strength  5/5  XI: sternocleidomastoid strength 5/5  XI: neck flexion strength  5/5  XII: tongue strength  Normal    Data Review Lab Results  Component Value Date   WBC 8.7 06/28/2019   HGB 13.9 06/28/2019   HCT 41.4 06/28/2019   MCV 94.3 06/28/2019   PLT 243 06/28/2019   Lab Results  Component Value Date   NA 137 06/28/2019   K 4.3 06/28/2019   CL 105 06/28/2019   CO2 21 (L) 06/28/2019   BUN 13  06/28/2019   CREATININE 0.68 06/28/2019   GLUCOSE 97 06/28/2019   Lab Results  Component Value Date   INR 0.9 06/28/2019    Assessment/Plan:  Estimated body mass index is 26.05 kg/m as calculated from the following:   Height as of this encounter: 4\' 11"  (1.499 m).   Weight as of this encounter: 58.5 kg. Patient admitted for LL with diskectomy L2-3, PLIF L4-5 Patient has failed a reasonable attempt at conservative therapy.  I explained the condition and procedure to the patient and answered any questions.  Patient wishes to proceed with procedure as planned. Understands risks/ benefits and typical outcomes  of procedure.   Eustace Moore 07/05/2019 8:00 AM

## 2019-07-05 NOTE — Op Note (Signed)
07/05/2019  11:57 AM  PATIENT:  [name]  64 y.o. 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 Decompressive lumbar, hemi-facetectomy and foraminotomy L4-5 requiring more work than would be required for a simple exposure of the disk for PLIF in order to adequately decompress the neural elements and address the spinal stenosis 2. Posterior lumbar interbody fusion L4-5 using porous titanium interbody cages packed with morcellized allograft and autograft 3. Posterior fixation L4-5 using Alphatec cortical pedicle screws.  4. Intertransverse arthrodesis L4-5 using morcellized autograft and allograft. 5.  Decompressive lumbar hemilaminectomy medial facetectomy foraminotomies L2-3 with discectomy, note this level is separate from the PLIF level  SURGEON:  Sherley Bounds, MD  ASSISTANTS: Duffy Rhody, MD  ANESTHESIA:  General  EBL: 150 ml  Total I/O In: 1000 [I.V.:1000] Out: 67 [Urine:370; Blood:150]  BLOOD ADMINISTERED:none  DRAINS: none   INDICATION FOR PROCEDURE: This patient presented with chronic back and leg pain. Imaging revealed large disc herniation L2 through stenosis and a degenerative spondylolisthesis L4-5 with stenosis. The patient tried a reasonable attempt at conservative medical measures without relief. I recommended decompression and instrumented fusion to address the stenosis as well as the segmental  instability.  Patient understood the risks, benefits, and alternatives and potential outcomes and wished to proceed.  PROCEDURE DETAILS:  The patient was brought to the operating room. After induction of generalized endotracheal anesthesia the patient was rolled into the prone position on chest rolls and all pressure points were padded. The patient's lumbar region was cleaned and then prepped with DuraPrep  and draped in the usual sterile fashion. Anesthesia was injected and then a dorsal midline incision was made and carried down to the lumbosacral fascia. The fascia was opened and the paraspinous musculature was taken down in a subperiosteal fashion to expose L2-3 on the left and L4-5 bilaterally.  This was done through separate incisions.   Intraoperative fluoroscopy confirmed my level at L2-3 on the left, and then I used a combination of the high-speed drill and the Kerrison punches to perform a hemilaminectomy, medial facetectomy, and foraminotomy at L2-3 on the left. The underlying yellow ligament was opened and removed in a piecemeal fashion to expose the underlying dura and exiting nerve root. I undercut the lateral recess and dissected down until I was medial to and distal to the pedicle. The nerve root was well decompressed. We then gently retracted the nerve root medially with a retractor, coagulated the epidural venous vasculature, and incised the disc space.  There was a large subannular disc herniation.  I was able to remove a very large fragment from the subannular space.  Performed a thorough intradiscal discectomy with pituitary rongeurs, until I had a nice decompression of the nerve root and the midline. I then palpated with a coronary dilator along the nerve root and into the foramen to assure adequate decompression. I felt no more compression of the nerve root. I irrigated with saline solution containing bacitracin.   A self-retaining retractor was placed at L4-5. Intraoperative fluoroscopy confirmed my level, and I started with placement of the L4 cortical pedicle screws. The pedicle screw entry zones were identified utilizing surface landmarks and  AP and lateral fluoroscopy. I scored the cortex with the high-speed drill and then used the hand drill to drill an upward and outward direction into the pedicle. I then tapped line to  line. I then placed a 5.5 x 40 mm cortical pedicle screw into the  pedicles of L4 bilaterally.    I then turned my attention to the decompression and complete lumbar laminectomies, hemi- facetectomies, and foraminotomies were performed at L4-5 bilaterally.  The bone was saved for later arthrodesis.  My nurse practitioner was directly involved in the decompression and exposure of the neural elements. the patient had significant spinal stenosis and this required more work than would be required for a simple exposure of the disc for posterior lumbar interbody fusion which would only require a limited laminotomy. Much more generous decompression and generous foraminotomy was undertaken in order to adequately decompress the neural elements and address the patient's leg pain. The yellow ligament was removed to expose the underlying dura and nerve roots, and generous foraminotomies were performed to adequately decompress the neural elements. Both the exiting and traversing nerve roots were decompressed on both sides until a coronary dilator passed easily along the nerve roots. Once the decompression was complete, I turned my attention to the posterior lower lumbar interbody fusion. The epidural venous vasculature was coagulated and cut sharply. Disc space was incised and the initial discectomy was performed with pituitary rongeurs. The disc space was distracted with sequential distractors to a height of 10 mm. We then used a series of scrapers and shavers to prepare the endplates for fusion. The midline was prepared with Epstein curettes. Once the complete discectomy was finished, we packed an appropriate sized interbody cage with local autograft and morcellized allograft, gently retracted the nerve root, and tapped the cage into position at L4-5.  The midline between the cages was packed with morselized autograft and allograft. We then turned our attention to the placement of the lower pedicle screws. The pedicle screw entry zones were identified utilizing surface landmarks and  fluoroscopy. I drilled into each pedicle utilizing the hand drill, and tapped each pedicle with the appropriate tap. We palpated with a ball probe to assure no break in the cortex. We then placed 5.5 x 45 mm cortical pedicle screws into the pedicles bilaterally at L5.   We then decorticated the transverse processes and laid a mixture of morcellized autograft and allograft out over these to perform intertransverse arthrodesis at L4-5. We then placed lordotic rods into the multiaxial screw heads of the pedicle screws and locked these in position with the locking caps and anti-torque device.  Dr. Marcello Moores assisted.  We then checked our construct with AP and lateral fluoroscopy. Irrigated with copious amounts of bacitracin-containing saline solution. Inspected the nerve roots once again to assure adequate decompression, lined to the dura with Gelfoam, and then we closed the muscle and the fascia with 0 Vicryl. Closed the subcutaneous tissues with 2-0 Vicryl and subcuticular tissues with 3-0 Vicryl. The skin was closed with benzoin and Steri-Strips. Dressing was then applied, the patient was awakened from general anesthesia and transported to the recovery room in stable condition. At the end of the procedure all sponge, needle and instrument counts were correct.   PLAN OF CARE: admit to inpatient  PATIENT DISPOSITION:  PACU - hemodynamically stable.   Delay start of Pharmacological VTE agent (>24hrs) due to surgical blood loss or risk of bleeding:  yes

## 2019-07-05 NOTE — Transfer of Care (Signed)
Immediate Anesthesia Transfer of Care Note  Patient: [name]  Procedure(s) Performed: Lumbar four-five posterior lumbar interbody fusion (N/A Back) Lumbar two-three decompression and discectomy (N/A Back)  Patient Location: PACU  Anesthesia Type:General  Level of Consciousness: awake, alert  and oriented  Airway & Oxygen Therapy: Patient Spontanous Breathing and Patient connected to face mask oxygen  Post-op Assessment: Report given to RN and Post -op Vital signs reviewed and stable  Post vital signs: Reviewed and stable  Last Vitals:  Vitals Value Taken Time  BP 91/56 07/05/19 1216  Temp    Pulse 81 07/05/19 1216  Resp 22 07/05/19 1216  SpO2 92 % 07/05/19 1216  Vitals shown include unvalidated device data.  Last Pain:  Vitals:   07/05/19 1200  TempSrc:   PainSc: 8          Complications: No apparent anesthesia complications

## 2019-07-05 NOTE — Anesthesia Postprocedure Evaluation (Signed)
Anesthesia Post Note  Patient: [name]  Procedure(s) Performed: Lumbar four-five posterior lumbar interbody fusion (N/A Back) Lumbar two-three decompression and discectomy (N/A Back)     Patient location during evaluation: PACU Anesthesia Type: General Level of consciousness: awake and alert Pain management: pain level controlled Vital Signs Assessment: post-procedure vital signs reviewed and stable Respiratory status: spontaneous breathing, nonlabored ventilation, respiratory function stable and patient connected to nasal cannula oxygen Cardiovascular status: blood pressure returned to baseline and stable Postop Assessment: no apparent nausea or vomiting Anesthetic complications: no    Last Vitals:  Vitals:   07/05/19 1230 07/05/19 1317  BP: 125/70 (!) 109/49  Pulse: 83 88  Resp: 15 18  Temp: 36.8 C 36.9 C  SpO2: 96% 97%    Last Pain:  Vitals:   07/05/19 1438  TempSrc:   PainSc: [place]

## 2019-07-06 ENCOUNTER — Encounter: Payer: Self-pay | Admitting: *Deleted

## 2019-07-06 MED FILL — Thrombin For Soln Kit 20000 Unit: CUTANEOUS | Qty: 1 | Status: AC

## 2019-07-06 NOTE — Discharge Instructions (Signed)

## 2019-07-06 NOTE — Evaluation (Signed)
Occupational Therapy Evaluation Patient Details Name: [name] MRN: [id number] DOB: 18-Nov-1955 Today's Date: 07/06/2019    History of Present Illness Pt is a 64 y/o female with worsening lower back and radiating LE pain, s/p PLIF L4-5 07/05/19. PMH signifcant for but not limited to anxiety, depression, cervical surgery, L shoulder surgery.    Clinical Impression   PTA patient independent and driving. Admitted for above and limited by back pain, decreased activity tolerance and precaution adherence.  Patient educated on precautions, brace mgmt and wear schedule, ADL compensatory techniques, AE/DME recommendations, activity progression and safety. She completes ADLs with supervision assistance, in room mobility and transfers with supervision.  Requires min cueing to adhere to back precautions during session, increased time for mobility due to pain.  She reports plan to have 24/7 assist from fiancee for the first 2 weeks after dc.  Recommended 3:1 commode for shower chair, and pt plans to self purchase toileting aide. She will benefit from further OT services acutely to optimize independence and safety with ADLs, but anticipate no further needs after dc home.     Follow Up Recommendations  No OT follow up;Supervision/Assistance - 24 hour(inital 24/7)    Equipment Recommendations  3 in 1 bedside commode    Recommendations for Other Services       Precautions / Restrictions Precautions Precautions: Back Precaution Booklet Issued: Yes (comment) Precaution Comments: reviewed with pt  Required Braces or Orthoses: Spinal Brace Spinal Brace: Lumbar corset Restrictions Weight Bearing Restrictions: No      Mobility Bed Mobility Overal bed mobility: Needs Assistance Bed Mobility: Rolling;Sidelying to Sit;Sit to Sidelying Rolling: Supervision Sidelying to sit: Supervision     Sit to sidelying: Supervision General bed mobility comments: to ensure log roll  technique  Transfers Overall transfer level: Needs assistance   Transfers: Sit to/from Stand Sit to Stand: Supervision         General transfer comment: for safety    Balance Overall balance assessment: Mild deficits observed, not formally tested                                         ADL either performed or assessed with clinical judgement   ADL Overall ADL's : Needs assistance/impaired     Grooming: Supervision/safety;Standing Grooming Details (indicate cue type and reason): cueing required to adhere to back precautions (leaning foward)  Upper Body Bathing: Set up;Sitting   Lower Body Bathing: Supervison/ safety;Sit to/from stand   Upper Body Dressing : Set up;Sitting   Lower Body Dressing: Set up;Supervision/safety;Sit to/from stand   Toilet Transfer: Supervision/safety;Ambulation;Grab bars   Toileting- Clothing Manipulation and Hygiene: Supervision/safety;Sit to/from stand;Sitting/lateral lean Toileting - Clothing Manipulation Details (indicate cue type and reason): reviewed techniques for hygiene, demonstrated use of toileting aide and educated on where to purchase     Functional mobility during ADLs: Supervision/safety General ADL Comments: pt educated on brace mgmt/wear schedule, ADL compesantoery techniques, precautions and DME; min cueing to adhere to precautions and limited by pain      Vision   Vision Assessment?: No apparent visual deficits     Perception     Praxis      Pertinent Vitals/Pain Pain Assessment: 0-10 Pain Score: 8  Pain Location: back, incisonal Pain Descriptors / Indicators: Discomfort;Grimacing;Operative site guarding Pain Intervention(s): Limited activity within patient's tolerance;Monitored during session;Repositioned     Hand Dominance     Extremity/Trunk  Assessment Upper Extremity Assessment Upper Extremity Assessment: Overall WFL for tasks assessed   Lower Extremity Assessment Lower Extremity  Assessment: Defer to PT evaluation   Cervical / Trunk Assessment Cervical / Trunk Assessment: Other exceptions Cervical / Trunk Exceptions: s/p back surgery   Communication Communication Communication: No difficulties   Cognition Arousal/Alertness: Awake/alert Behavior During Therapy: WFL for tasks assessed/performed Overall Cognitive Status: Within Functional Limits for tasks assessed                                     General Comments       Exercises     Shoulder Instructions      Home Living Family/patient expects to be discharged to:: Private residence Living Arrangements: Spouse/significant other Available Help at Discharge: Other (Comment)(fiancee for 2 weeks)         Home Layout: One level     Bathroom Shower/Tub: Teacher, early years/pre: Standard     Home Equipment: None          Prior Functioning/Environment Level of Independence: Independent        Comments: takes care of grandkid (2.5), drives        OT Problem List: Decreased activity tolerance;Decreased knowledge of use of DME or AE;Decreased knowledge of precautions;Pain;Decreased safety awareness      OT Treatment/Interventions: Self-care/ADL training;DME and/or AE instruction;Therapeutic activities;Patient/family education;Energy conservation    OT Goals(Current goals can be found in the care plan section) Acute Rehab OT Goals Patient Stated Goal: home and less pain OT Goal Formulation: With patient Time For Goal Achievement: 07/20/19 Potential to Achieve Goals: Good  OT Frequency: Min 2X/week   Barriers to D/C:            Co-evaluation              AM-PAC OT "6 Clicks" Daily Activity     Outcome Measure Help from another person eating meals?: None Help from another person taking care of personal grooming?: A Little Help from another person toileting, which includes using toliet, bedpan, or urinal?: A Little Help from another person bathing  (including washing, rinsing, drying)?: A Little Help from another person to put on and taking off regular upper body clothing?: A Little Help from another person to put on and taking off regular lower body clothing?: A Little 6 Click Score: 19   End of Session Equipment Utilized During Treatment: Back brace Nurse Communication: Mobility status  Activity Tolerance: Patient tolerated treatment well Patient left: with call bell/phone within reach;in bed  OT Visit Diagnosis: Other abnormalities of gait and mobility (R26.89);Pain Pain - part of body: (back-incisonal)                Time: [id number] OT Time Calculation (min): 18 min Charges:  OT General Charges $OT Visit: 1 Visit OT Evaluation $OT Eval Low Complexity: 1 Low  Jolaine Artist, OT Acute Rehabilitation Services Pager [phone] Office [phone]   Delight Stare 07/06/2019, 10:20 AM

## 2019-07-06 NOTE — Discharge Summary (Signed)
Physician Discharge Summary  Patient ID: [name] MRN: [id number] DOB/AGE: 08-07-1955 64 y.o.  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 The patient tolerated the procedure well and was taken to the recovery room and then to the floor in stable condition. The hospital course was routine. There were no complications. The wound remained clean dry and intact. Pt had appropriate back soreness. No complaints of leg pain or new N/T/W. The patient remained afebrile with stable vital signs, and tolerated a regular diet. The patient continued to increase activities, and pain was well controlled with oral pain medications.   Consults: None  Significant Diagnostic Studies:  Results for orders placed or performed during the hospital encounter of 07/01/19  SARS CORONAVIRUS 2 (TAT 6-24 HRS) Nasopharyngeal Nasopharyngeal Swab   Specimen: Nasopharyngeal Swab  Result Value Ref Range   SARS Coronavirus 2 NEGATIVE NEGATIVE    Chest 2 View  Result Date: 06/28/2019 CLINICAL DATA:  Preoperative respiratory evaluation prior to unspecified cervical surgery. Current smoker. EXAM: CHEST - 2 VIEW COMPARISON:  04/25/2007. FINDINGS: Cardiomediastinal silhouette unremarkable and unchanged. Mild hyperinflation, unchanged. Lungs clear. Bronchovascular markings normal. Pulmonary vascularity normal. No visible pleural effusions. No pneumothorax. Degenerative changes involving the thoracic and upper lumbar spine. Prior lower cervical fusion. IMPRESSION: Mild hyperinflation indicating COPD and/or asthma. No acute cardiopulmonary disease. Electronically Signed   By: Evangeline Dakin  M.D.   On: 06/28/2019 14:49   DG Lumbar Spine 2-3 Views  Result Date: 07/05/2019 CLINICAL DATA:  Intraoperative imaging for lumbar fusion. EXAM: LUMBAR SPINE - 2-3 VIEW COMPARISON:  MRI lumbar spine 05/27/2019. FINDINGS: Two fluoroscopic intraoperative spot views of the lumbar spine demonstrate pedicle screws, stabilization bars and interbody spacer in place at L4-5. No acute abnormality is identified. IMPRESSION: Intraoperative imaging for L4-5 fusion. Electronically Signed   By: Inge Rise M.D.   On: 07/05/2019 11:37   DG C-Arm 1-60 Min  Result Date: 07/05/2019 CLINICAL DATA:  L4-5 fusion. EXAM: DG C-ARM 1-60 MIN CONTRAST:  None. FLUOROSCOPY TIME:  Fluoroscopy Time:  1 minutes 20 seconds Radiation Exposure Index (if provided by the fluoroscopic device): Unknown. Number of Acquired Spot Images: 2 COMPARISON:  MRI 05/27/2019 FINDINGS: Two intraoperative fluoroscopic images over the lumbosacral spine demonstrate posterior spinal fusion hardware with bilateral pedicle screws at the L4-5 level intact. Interbody fusion at the L4-5 level. Mild disc space narrowing at the L5-S1 level. Mild spondylosis is present. IMPRESSION: Postsurgical change at the L4-5 level as described with hardware intact. Electronically Signed   By: Marin Olp M.D.   On: 07/05/2019 11:39    Antibiotics:  Anti-infectives (From admission, onward)   Start     Dose/Rate Route Frequency Ordered Stop   07/05/19 1630  ceFAZolin (ANCEF) IVPB 2g/100 mL premix     2 g 200 mL/hr over 30 Minutes Intravenous Every 8 hours 07/05/19 1301 07/06/19 0810   07/05/19 0948  bacitracin 50,000 Units in sodium chloride 0.9 % 500 mL irrigation  Status:  Discontinued       As needed 07/05/19 0949 07/05/19 1222   07/05/19 0630  ceFAZolin (ANCEF) IVPB 2g/100 mL premix     2 g 200 mL/hr over 30 Minutes Intravenous On call to O.R.  07/05/19 0624 07/05/19 0840      Discharge Exam: Blood pressure 116/60, pulse 81, temperature 99 F (37.2 C),  temperature source Oral, resp. rate 16, height 4\' 11"  (1.499 m), weight 58.5 kg, SpO2 97 %. Neurologic: Grossly normal Ambulating and voiding well, incision cdi  Discharge Medications:   Allergies as of 07/06/2019      Reactions   Codeine Nausea And Vomiting   Sertraline Other (See Comments)   Confusion    Prednisone Anxiety   Jittery       Medication List    TAKE these medications   Align 4 MG Caps Take 4 mg by mouth daily.   ALPRAZolam 1 MG tablet Commonly known as: XANAX Take 1 mg by mouth 3 (three) times daily as needed for anxiety.   APPLE CIDER VINEGAR PO Take 450 mg by mouth 2 (two) times daily.   aspirin-sod bicarb-citric acid 325 MG Tbef tablet Commonly known as: ALKA-SELTZER Take 650 mg by mouth daily as needed (indigestion).   cyclobenzaprine 10 MG tablet Commonly known as: FLEXERIL Take 10 mg by mouth at bedtime as needed for muscle spasms.   Dialyvite Vitamin D 5000 125 MCG (5000 UT) capsule Generic drug: Cholecalciferol Take 5,000 Units by mouth in the morning and at bedtime.   escitalopram 20 MG tablet Commonly known as: LEXAPRO Take 20 mg by mouth daily.   Flax Seed Oil 1000 MG Caps Take 1,000 mg by mouth daily.   gabapentin 100 MG capsule Commonly known as: NEURONTIN Take 100 mg by mouth at bedtime as needed (back pain).   multivitamin with minerals Tabs tablet Take 1 tablet by mouth daily. Centrum   nabumetone 500 MG tablet Commonly known as: RELAFEN Take 500 mg by mouth 2 (two) times daily as needed for moderate pain.   Neuriva Caps Take 1 capsule by mouth daily.   oxyCODONE-acetaminophen 10-325 MG tablet Commonly known as: PERCOCET Take 0.5-1 tablets by mouth 3 (three) times daily as needed for pain.   REFRESH OPTIVE MEGA-3 OP Place 1 drop into both eyes 5 (five) times daily as needed (dry eyes).   Turmeric 500 MG Caps Take 500 mg by mouth 2 (two) times daily.            Durable Medical Equipment  (From admission, onward)          Start     Ordered   07/05/19 1302  DME Walker rolling  Once    Question:  Patient needs a walker to treat with the following condition  Answer:  S/P lumbar fusion   07/05/19 1301   07/05/19 1302  DME 3 n 1  Once     07/05/19 1301          Disposition: home   Final Dx: PLIF L4-5  Discharge Instructions     Remove dressing in 72 hours   Complete by: As directed    Call MD for:  difficulty breathing, headache or visual disturbances   Complete by: As directed    Call MD for:  hives   Complete by: As directed    Call MD for:  persistant dizziness or light-headedness   Complete by: As directed    Call MD for:  persistant nausea and vomiting   Complete by: As directed    Call MD for:  redness, tenderness, or signs of infection (pain, swelling, redness, odor or green/yellow discharge around incision site)   Complete by: As directed    Call MD for:  severe  uncontrolled pain   Complete by: As directed    Call MD for:  temperature >100.4   Complete by: As directed    Diet - low sodium heart healthy   Complete by: As directed    Driving Restrictions   Complete by: As directed    No driving for 2 weeks, no riding in the car for 1 week   Increase activity slowly   Complete by: As directed    Lifting restrictions   Complete by: As directed    No lifting more than 8 lbs         Signed: Ocie Cornfield Kirubel Aja 07/06/2019, 8:32 AM

## 2019-07-06 NOTE — Evaluation (Signed)
Physical Therapy Evaluation Patient Details Name: [name] MRN: [id number] DOB: July 26, 1955 Today's Date: 07/06/2019   History of Present Illness  Pt is a 64 y/o female with worsening lower back and radiating LE pain, s/p PLIF L4-5 07/05/19. PMH signifcant for but not limited to anxiety, depression, cervical surgery, L shoulder surgery.     Clinical Impression  Pt presented supine in bed with HOB elevated, awake and willing to participate in therapy session. Prior to admission, pt reported that she was independent with all functional mobility and ADLs. Pt lives with her fiance in a single level home with five steps to enter. At the time of evaluation, pt overall at a supervision level for all functional mobility including gait and stair training. PT provided pt education re: back precautions, car transfers and a generalized walking program for pt to initiate upon d/c home. No further acute PT needs identified at this time. PT signing off.     Follow Up Recommendations No PT follow up    Equipment Recommendations  None recommended by PT    Recommendations for Other Services       Precautions / Restrictions Precautions Precautions: Back Precaution Booklet Issued: Yes (comment) Precaution Comments: reviewed with pt  Required Braces or Orthoses: Spinal Brace Spinal Brace: Lumbar corset Restrictions Weight Bearing Restrictions: No      Mobility  Bed Mobility Overal bed mobility: Needs Assistance Bed Mobility: Sidelying to Sit;Sit to Sidelying Rolling: Supervision Sidelying to sit: Supervision     Sit to sidelying: Supervision General bed mobility comments: to ensure log roll technique  Transfers Overall transfer level: Needs assistance Equipment used: None Transfers: Sit to/from Stand Sit to Stand: Supervision         General transfer comment: for safety  Ambulation/Gait Ambulation/Gait assistance: Supervision Gait Distance (Feet): 150 Feet Assistive device:  None Gait Pattern/deviations: Step-through pattern;Decreased stride length Gait velocity: decreased   General Gait Details: pt with mild instability but no overt LOB or need for physical assistance; supervision for safety  Stairs Stairs: Yes Stairs assistance: Supervision Stair Management: One rail Left;Step to pattern;Alternating pattern;Forwards Number of Stairs: 5 General stair comments: no instability or difficulties noted  Wheelchair Mobility    Modified Rankin (Stroke Patients Only)       Balance Overall balance assessment: Mild deficits observed, not formally tested                                           Pertinent Vitals/Pain Pain Assessment: Faces Pain Score: 8  Faces Pain Scale: Hurts little more Pain Location: back, incisonal Pain Descriptors / Indicators: Discomfort;Grimacing;Operative site guarding Pain Intervention(s): Monitored during session;Repositioned    Home Living Family/patient expects to be discharged to:: Private residence Living Arrangements: Spouse/significant other Available Help at Discharge: Other (Comment)(fiancee for 2 weeks)         Home Layout: One level Home Equipment: None      Prior Function Level of Independence: Independent         Comments: takes care of grandkid (2.5), drives     Hand Dominance        Extremity/Trunk Assessment   Upper Extremity Assessment Upper Extremity Assessment: Defer to OT evaluation;Overall [facility] for tasks assessed    Lower Extremity Assessment Lower Extremity Assessment: Overall WFL for tasks assessed    Cervical / Trunk Assessment Cervical / Trunk Assessment: Other exceptions Cervical /  Trunk Exceptions: s/p back surgery  Communication   Communication: No difficulties  Cognition Arousal/Alertness: Awake/alert Behavior During Therapy: WFL for tasks assessed/performed Overall Cognitive Status: Within Functional Limits for tasks assessed                                         General Comments      Exercises     Assessment/Plan    PT Assessment Patent does not need any further PT services  PT Problem List         PT Treatment Interventions      PT Goals (Current goals can be found in the Care Plan section)  Acute Rehab PT Goals Patient Stated Goal: decrease pain PT Goal Formulation: All assessment and education complete, DC therapy    Frequency     Barriers to discharge        Co-evaluation               AM-PAC PT "6 Clicks" Mobility  Outcome Measure Help needed turning from your back to your side while in a flat bed without using bedrails?: None Help needed moving from lying on your back to sitting on the side of a flat bed without using bedrails?: None Help needed moving to and from a bed to a chair (including a wheelchair)?: None Help needed standing up from a chair using your arms (e.g., wheelchair or bedside chair)?: None Help needed to walk in hospital room?: None Help needed climbing 3-5 steps with a railing? : None 6 Click Score: 24    End of Session Equipment Utilized During Treatment: Back brace Activity Tolerance: Patient tolerated treatment well Patient left: in bed;with call bell/phone within reach Nurse Communication: Mobility status PT Visit Diagnosis: Other abnormalities of gait and mobility (R26.89)    Time: [id number] PT Time Calculation (min) (ACUTE ONLY): 17 min   Charges:   PT Evaluation $PT Eval Moderate Complexity: 1 Mod          Eduard Clos, PT, DPT  Acute Rehabilitation Services Pager [phone] Office [place] 07/06/2019, 12:10 PM

## 2019-07-06 NOTE — Progress Notes (Signed)
Patient is discharged from room 3C07 at this time. Alert and in stable condition. IV site d/c'd and instructions read to patient and spouse with understanding verbalized and all questions answered. Left unit via wheelchair with all belongings at side. 

## 2019-09-30 ENCOUNTER — Emergency Department (HOSPITAL_COMMUNITY)
Admission: EM | Admit: 2019-09-30 | Discharge: 2019-09-30 | Disposition: A | Discharge status: Home / Self Care | Payer: Medicare Other | Attending: Emergency Medicine | Admitting: Emergency Medicine

## 2019-09-30 ENCOUNTER — Other Ambulatory Visit: Payer: Self-pay

## 2019-09-30 ENCOUNTER — Emergency Department (HOSPITAL_COMMUNITY): Payer: Medicare Other

## 2019-09-30 ENCOUNTER — Encounter (HOSPITAL_COMMUNITY): Payer: Self-pay

## 2019-09-30 DIAGNOSIS — Y999 Unspecified external cause status: Secondary | ICD-10-CM | POA: Diagnosis not present

## 2019-09-30 DIAGNOSIS — F17210 Nicotine dependence, cigarettes, uncomplicated: Secondary | ICD-10-CM | POA: Insufficient documentation

## 2019-09-30 DIAGNOSIS — W108XXA Fall (on) (from) other stairs and steps, initial encounter: Secondary | ICD-10-CM | POA: Diagnosis not present

## 2019-09-30 DIAGNOSIS — S61421A Laceration with foreign body of right hand, initial encounter: Secondary | ICD-10-CM | POA: Diagnosis not present

## 2019-09-30 DIAGNOSIS — Y9289 Other specified places as the place of occurrence of the external cause: Secondary | ICD-10-CM | POA: Insufficient documentation

## 2019-09-30 DIAGNOSIS — Y9389 Activity, other specified: Secondary | ICD-10-CM | POA: Insufficient documentation

## 2019-09-30 DIAGNOSIS — S61412A Laceration without foreign body of left hand, initial encounter: Secondary | ICD-10-CM

## 2019-09-30 MED ORDER — HYDROCODONE-ACETAMINOPHEN 5-325 MG PO TABS
2.0000 | ORAL_TABLET | Freq: Once | ORAL | Status: DC
  Filled 2019-09-30: qty 2

## 2019-09-30 MED ORDER — LIDOCAINE HCL (PF) 1 % IJ SOLN
30.0000 mL | Freq: Once | INTRAMUSCULAR | Status: AC
  Administered 2019-09-30: 30 mL
  Filled 2019-09-30: qty 30

## 2019-09-30 MED ORDER — CEPHALEXIN 500 MG PO CAPS
500.0000 mg | ORAL_CAPSULE | Freq: Four times a day (QID) | ORAL | 0 refills | Status: DC
Start: 2019-09-30 — End: 2022-04-16

## 2019-09-30 MED ORDER — OXYCODONE-ACETAMINOPHEN 5-325 MG PO TABS
2.0000 | ORAL_TABLET | Freq: Once | ORAL | Status: AC
  Administered 2019-09-30: 2 via ORAL
  Filled 2019-09-30: qty 2

## 2019-09-30 NOTE — ED Triage Notes (Signed)
Pt reports she was walking a dog and tripped and fell. Pt was wearing a ring on left ring finger which sliced her left pinky finger open. Pt has deep laceration to left pinky and some smaller lacerations to ring finger and middle finger. Pt also endorses left elbow pain. Bleeding controlled at this time. Pt denies hitting her head. Pt denies blood thinners.

## 2019-09-30 NOTE — ED Provider Notes (Signed)
[facility] DEPT Provider Note   CSN: [id number] Arrival date & time: 09/30/19  1224     History Chief Complaint  Patient presents with  . Laceration    Prosperity L Ky [name] is a 64 y.o. female.  [name] is a 64 y.o. female with history of [name], [name], [name], [name] of multiple hand lacerations after a fall.  She states that she was trying to take her daughter's dog out and when she opened the door to go down the steps there were 2 dogs in the yard, the dog lunged pulling her down the steps and she fell forward landing primarily on her left hand and elbow.  She states that the ring on her ring finger caused a large laceration to her pinky finger and she also has small lacerations to the ring finger and middle finger.  Immediately was bleeding heavily but with pressure she was able to get this under control.  She states pain in the hand as well as the elbow where she has a scrape.  Tetanus vaccine is up-to-date, she is not on any blood thinners.  Denies numbness or tingling in the hand but has some pain with movement in those 3 fingers.  She did not hit her head and denies other injuries from the swallowing, no neck or back pain, no chest or abdominal pain and no pain in her other extremities.  Dates that she just feels a bit shaken up.  Has not taken anything for pain prior to arrival, is on [facility] pain medication after recent back surgery in May.        Past Medical History:  Diagnosis Date  . [facility]   . Arthritis   . Cataract   . [facility] neck and back pain   . [name]   . [name] (gastroesophageal reflux disease)     Patient Active Problem List   Diagnosis Date Noted  . S/P lumbar [name] 07/05/2019    Past Surgical History:  Procedure Laterality Date  . ABDOMINAL HYSTERECTOMY    . APPENDECTOMY    . BACK SURGERY     cervical sx. by Dr. Sherley Bounds  . BREAST SURGERY     bilateral breast reduction  . CERVICAL SPINE SURGERY    . CHOLECYSTECTOMY    . EYE SURGERY Bilateral    laser; cataract  . FRACTURE SURGERY Left    wrist  . left shoulder surgery    . LUMBAR LAMINECTOMY/DECOMPRESSION MICRODISCECTOMY N/A 07/05/2019   Procedure: Lumbar two-three decompression and discectomy;  Surgeon: Eustace Moore, MD;  Location: [facility];  Service: Neurosurgery;  Laterality: N/A;  . TONSILLECTOMY    . WRIST SURGERY       OB History   No obstetric history on file.     Family History  Problem Relation Age of Onset  . Colon cancer Neg Hx   . Esophageal cancer Neg Hx   . Pancreatic cancer Neg Hx   . Rectal cancer Neg Hx   . Stomach cancer Neg Hx     Social History   Tobacco Use  . Smoking status: Current Every Day Smoker  . Smokeless tobacco: Never Used  . Tobacco comment: Quit in 2015  Vaping Use  . Vaping Use: Never used  Substance Use Topics  . Alcohol use: Not on file    Comment: occ wine and beer.   . Drug use: No  Home Medications Prior to Admission medications   Medication Sig Start Date End Date Taking? Authorizing Provider  ALPRAZolam Duanne Moron) 1 MG tablet Take 1 mg by mouth 3 (three) times daily as needed for [facility].     [provider]  APPLE CIDER VINEGAR PO Take 450 mg by mouth 2 (two) times daily.    [provider]  aspirin-sod bicarb-citric acid (ALKA-SELTZER) 325 MG TBEF tablet Take 650 mg by mouth daily as needed (indigestion).    [provider]  Carboxymeth-Glycerin-Polysorb (REFRESH OPTIVE MEGA-3 OP) Place 1 drop into both eyes 5 (five) times daily as needed (dry eyes).    [provider]  cephALEXin (KEFLEX) 500 MG capsule Take 1 capsule (500 mg total) by mouth 4 (four) times daily. 09/30/19   Jacqlyn Larsen, PA-C  Cholecalciferol (DIALYVITE VITAMIN D 5000) 125 MCG (5000 UT) capsule Take 5,000 Units by mouth in the morning and at bedtime.    [provider]    cyclobenzaprine (FLEXERIL) 10 MG tablet Take 10 mg by mouth at bedtime as needed for muscle spasms.    [provider]  escitalopram (LEXAPRO) 20 MG tablet Take 20 mg by mouth daily. 07/02/17   [provider]  Flaxseed, Linseed, (FLAX SEED OIL) 1000 MG CAPS Take 1,000 mg by mouth daily.    [provider]  gabapentin (NEURONTIN) 100 MG capsule Take 100 mg by mouth at bedtime as needed (back pain).    [provider]  Misc Natural Products (NEURIVA) CAPS Take 1 capsule by mouth daily.    [provider]  Multiple Vitamin (MULTIVITAMIN WITH MINERALS) TABS tablet Take 1 tablet by mouth daily. Centrum    [provider]  nabumetone (RELAFEN) 500 MG tablet Take 500 mg by mouth 2 (two) times daily as needed for moderate pain.    [provider]  oxyCODONE-acetaminophen (PERCOCET) 10-325 MG per tablet Take 0.5-1 tablets by mouth 3 (three) times daily as needed for pain.     [provider]  Probiotic Product (ALIGN) 4 MG CAPS Take 4 mg by mouth daily.    [provider]  Turmeric 500 MG CAPS Take 500 mg by mouth 2 (two) times daily.    [provider]    Allergies    Codeine, Sertraline, and Prednisone  Review of Systems   Review of Systems  Constitutional: Negative for chills and fever.  Musculoskeletal: Positive for arthralgias. Negative for back pain and neck pain.  Skin: Positive for wound.  Neurological: Negative for weakness and numbness.  All other systems reviewed and are negative.   Physical Exam Updated Vital Signs BP (!) 141/81 (BP Location: Right Arm)   Pulse 85   Temp 98.7 F (37.1 C) (Oral)   Resp 18   SpO2 99%   Physical Exam Vitals and nursing note reviewed.  Constitutional:      General: She is not in acute distress.    Appearance: Normal appearance. She is well-developed and normal weight. She is not ill-appearing or diaphoretic.  HENT:     Head: Normocephalic and atraumatic.   Eyes:     General:        Right eye: No discharge.        Left eye: No discharge.  Pulmonary:     Effort: Pulmonary effort is normal. No respiratory distress.  Musculoskeletal:     Comments: See photo below Left hand with multiple lacerations, the largest of which is present on the fifth finger and extends  across the MCP joint, up the medial aspect of the finger as well as across the palmar aspect of the hand just proximal to the PIP joint, bleeding is controlled. On the ring finger there is a 2 cm linear laceration across the proximal phalanx On the middle finger there is a laceration across the dorsal surface of the PIP joint No visible tendon and patient is able to flex and extend all fingers Sensation intact on all fingers 2+ radial pulse and good capillary refill throughout  Neurological:     Mental Status: She is alert and oriented to person, place, and time.     Coordination: Coordination normal.  Psychiatric:        Mood and Affect: Mood normal.        Behavior: Behavior normal.        ED Results / Procedures / Treatments   Labs (all labs ordered are listed, but only abnormal results are displayed) Labs Reviewed - No data to display  EKG None  Radiology DG Elbow Complete Left  Result Date: 09/30/2019 CLINICAL DATA:  Status post fall. EXAM: LEFT ELBOW - COMPLETE 3+ VIEW COMPARISON:  None. FINDINGS: No evidence for acute displaced fracture. Regional soft tissues unremarkable. Mild degenerative changes. IMPRESSION: No acute osseous abnormality. Electronically Signed   By: Lovey Newcomer M.D.   On: 09/30/2019 13:54   DG Hand Complete Left  Result Date: 09/30/2019 CLINICAL DATA:  Patient with lacerations after injury. EXAM: LEFT HAND - COMPLETE 3+ VIEW COMPARISON:  None. FINDINGS: Distal ulnar plate and screw fixation. Bandaging material overlies the third, fourth and fifth digits. Underlying soft tissue abnormality. No definite evidence for associated acute fractures.  IMPRESSION: No definite evidence for acute fracture. Bandaging material overlying the third, fourth and fifth digits limits [name]. There appears to be associated soft tissue injury. Electronically Signed   By: Lovey Newcomer M.D.   On: 09/30/2019 13:54    Procedures .Marland KitchenLaceration Repair  Date/Time: 09/30/2019 3:34 PM Performed by: Jacqlyn Larsen, PA-C Authorized by: Jacqlyn Larsen, PA-C   Consent:    Consent obtained:  Verbal   Consent given by:  Patient   Risks discussed:  Infection, poor cosmetic result, poor wound healing and need for additional repair   Alternatives discussed:  No treatment Anesthesia (see MAR for exact dosages):    Anesthesia method:  Nerve block   Block anesthetic:  Lidocaine 1% w/o epi   Block injection procedure:  Anatomic landmarks identified, introduced needle and incremental injection   Block outcome:  Anesthesia achieved Laceration details:    Location:  Finger   Finger location:  L long finger   Length (cm):  2.5   Depth (mm):  3 Repair type:    Repair type:  Simple Pre-procedure details:    Preparation:  Patient was prepped and draped in usual sterile fashion and imaging obtained to evaluate for foreign bodies Exploration:    Hemostasis achieved with:  Direct pressure   Wound exploration: wound explored through full range of motion and entire depth of wound probed and visualized     Wound extent: areolar tissue violated     Wound extent: no foreign bodies/material noted, no tendon damage noted and no underlying fracture noted   Treatment:    Area cleansed with:  Saline   Amount of cleaning:  Extensive   Irrigation solution:  Sterile saline   Irrigation volume:  1500 mL   Irrigation method:  Pressure wash Skin repair:    Repair method:  Sutures   Suture size:  4-0   Suture material:  Prolene   Suture technique:  Simple interrupted   Number of sutures:  4 Approximation:    Approximation:  Close Post-procedure details:    Dressing:  Bulky  dressing and non-adherent dressing   Patient tolerance of procedure:  Tolerated well, no immediate complications .Marland KitchenLaceration Repair  Date/Time: 09/30/2019 3:36 PM Performed by: Jacqlyn Larsen, PA-C Authorized by: Jacqlyn Larsen, PA-C   Consent:    Consent obtained:  Verbal   Consent given by:  Patient   Risks discussed:  Pain, poor cosmetic result, poor wound healing and need for additional repair   Alternatives discussed:  No treatment Anesthesia (see MAR for exact dosages):    Anesthesia method:  Nerve block   Block needle gauge:  25 G   Block anesthetic:  Lidocaine 1% w/o epi   Block injection procedure:  Anatomic landmarks identified, introduced needle and incremental injection   Block outcome:  Anesthesia achieved Laceration details:    Location:  Finger   Finger location:  L ring finger   Length (cm):  2   Depth (mm):  3 Pre-procedure details:    Preparation:  Imaging obtained to evaluate for foreign bodies and patient was prepped and draped in usual sterile fashion Exploration:    Hemostasis achieved with:  Direct pressure   Wound exploration: wound explored through full range of motion and entire depth of wound probed and visualized     Wound extent: areolar tissue violated     Wound extent: no tendon damage noted and no underlying fracture noted   Treatment:    Area cleansed with:  Saline   Amount of cleaning:  Extensive   Irrigation solution:  Sterile saline   Irrigation volume:  1500 mL   Irrigation method:  Pressure wash Skin repair:    Repair method:  Sutures   Suture size:  4-0   Suture material:  Prolene   Suture technique:  Simple interrupted   Number of sutures:  2 Approximation:    Approximation:  Close Post-procedure details:    Dressing:  Non-adherent dressing and bulky dressing   Patient tolerance of procedure:  Tolerated well, no immediate complications .Marland KitchenLaceration Repair  Date/Time: 09/30/2019 3:41 PM Performed by: Jacqlyn Larsen,  PA-C Authorized by: Jacqlyn Larsen, PA-C   Consent:    Consent obtained:  Verbal   Consent given by:  Patient   Risks discussed:  Infection, pain, need for additional repair, poor cosmetic result and poor wound healing   Alternatives discussed:  No treatment Anesthesia (see MAR for exact dosages):    Anesthesia method:  Nerve block and local infiltration   Local anesthetic:  Lidocaine 1% w/o epi   Block needle gauge:  25 G   Block anesthetic:  Lidocaine 1% w/o epi   Block injection procedure:  Introduced needle, incremental injection and anatomic landmarks identified   Block outcome:  Incomplete block Laceration details:    Location:  Finger   Finger location:  L small finger   Length (cm):  6   Depth (mm):  5 Repair type:    Repair type:  Intermediate Pre-procedure details:    Preparation:  Patient was prepped and draped in usual sterile fashion and imaging obtained to evaluate for foreign bodies Exploration:    Hemostasis achieved with:  Direct pressure   Wound exploration: wound explored through full range of motion and entire depth of wound probed and visualized  Wound extent: areolar tissue violated     Wound extent: no foreign bodies/material noted, no tendon damage noted and no underlying fracture noted   Treatment:    Area cleansed with:  Saline   Amount of cleaning:  Extensive   Irrigation solution:  Sterile saline   Irrigation volume:  1500 mL   Irrigation method:  Pressure wash Skin repair:    Repair method:  Sutures   Suture size:  4-0   Suture material:  Prolene   Suture technique:  Simple interrupted   Number of sutures:  13 Approximation:    Approximation:  Close Post-procedure details:    Dressing:  Non-adherent dressing and bulky dressing   Patient tolerance of procedure:  Tolerated well, no immediate complications   (including critical care time)  Medications Ordered in ED Medications  lidocaine (PF) (XYLOCAINE) 1 % injection 30 mL (30 mLs  Infiltration Given 09/30/19 1353)  oxyCODONE-acetaminophen (PERCOCET/ROXICET) 5-325 MG per tablet 2 tablet (2 tablets Oral Given 09/30/19 1419)    ED Course  I have reviewed the triage vital signs and the nursing notes.  Pertinent labs & imaging results that were available during my care of the patient were reviewed by me and considered in my medical decision making (see chart for details).    MDM Rules/Calculators/A&P                          64 year old female with multiple lacerations to the left hand after a fall, has some pain over the hand and elbow but did not hit her head and did not sustain other injuries.  Tetanus is up-to-date.  Fortunately x-rays with no evidence of fracture or foreign body.  Wounds were extensively washed with normal saline, fingers anesthetized using nerve block, repaired using simple interrupted sutures.  Fifth finger with larger laceration and 3 portions that required 13 sutures to approximate.  Patient with good range of motion of all fingers.  Bulky protective dressing applied with nonadherent Xeroform over the wounds.  Discussed appropriate wound care, suture removal in 7 to 10 days, given extensive lacerations will place on Keflex for infection prevention, discussed signs of action that should warrant sooner return.  Discharged home in good condition.  Final Clinical Impression(s) / ED Diagnoses Final diagnoses:  Complicated laceration of hand, left, initial encounter    Rx / DC Orders ED Discharge Orders         Ordered    cephALEXin (KEFLEX) 500 MG capsule  4 times daily     Discontinue  Reprint     09/30/19 [place], Favor Hackler N, [place] 09/30/19 1602    Dorie Rank, MD 10/02/19 [id number]

## 2019-09-30 NOTE — Discharge Instructions (Signed)
1. Medications: Tylenol or NSAIDs for pain, usual home medications, Take KEFLEX to prevent infection 2. Treatment: ice for swelling, keep wound clean with warm soap and water and keep bandage dry, do not get wet for 24 hours, no swimming or submerging hand in water 3. Follow Up: Please return or follow up with primary care provider in 7-10 days to have your stitches removed or sooner if you have concerns. Return to the emergency department for increased redness, drainage of pus from the wound, or fevers/chills.   WOUND CARE  Keep area clean and dry for 24 hours. Do not remove bandage, if applied.  After 24 hours, remove bandage and wash wound gently with mild soap and warm water. Reapply a new bandage after cleaning wound, if directed.   Continue daily cleansing with soap and water until stitches/staples are removed.  Do not apply any ointments or creams to the wound while stitches/staples are in place, as this may cause delayed healing. Return if you experience any of the following signs of infection: Swelling, redness, pus drainage, streaking, fever >101.0 F  Return if you experience excessive bleeding that does not stop after 15-20 minutes of constant, firm pressure.

## 2019-10-11 DIAGNOSIS — M79641 Pain in right hand: Secondary | ICD-10-CM | POA: Insufficient documentation

## 2019-10-11 DIAGNOSIS — L03113 Cellulitis of right upper limb: Secondary | ICD-10-CM | POA: Insufficient documentation

## 2019-10-11 DIAGNOSIS — S61219A Laceration without foreign body of unspecified finger without damage to nail, initial encounter: Secondary | ICD-10-CM | POA: Insufficient documentation

## 2019-10-12 DIAGNOSIS — Z4789 Encounter for other orthopedic aftercare: Secondary | ICD-10-CM | POA: Insufficient documentation

## 2019-10-30 DIAGNOSIS — M79642 Pain in left hand: Secondary | ICD-10-CM | POA: Insufficient documentation

## 2019-11-21 ENCOUNTER — Other Ambulatory Visit (HOSPITAL_COMMUNITY): Payer: Self-pay | Admitting: Neurological Surgery

## 2019-12-13 ENCOUNTER — Other Ambulatory Visit (HOSPITAL_COMMUNITY): Payer: Self-pay | Admitting: Family Medicine

## 2019-12-27 ENCOUNTER — Other Ambulatory Visit (HOSPITAL_COMMUNITY): Payer: Self-pay | Admitting: Family Medicine

## 2019-12-28 ENCOUNTER — Other Ambulatory Visit (HOSPITAL_COMMUNITY): Payer: Self-pay | Admitting: Neurological Surgery

## 2020-01-29 ENCOUNTER — Other Ambulatory Visit (HOSPITAL_COMMUNITY): Payer: Self-pay | Admitting: Family Medicine

## 2020-02-17 DIAGNOSIS — U071 COVID-19: Secondary | ICD-10-CM | POA: Diagnosis not present

## 2020-02-24 DIAGNOSIS — J988 Other specified respiratory disorders: Secondary | ICD-10-CM | POA: Diagnosis not present

## 2020-02-27 ENCOUNTER — Other Ambulatory Visit (HOSPITAL_COMMUNITY): Payer: Self-pay | Admitting: Family Medicine

## 2020-03-13 ENCOUNTER — Other Ambulatory Visit (HOSPITAL_COMMUNITY): Payer: Self-pay | Admitting: Family Medicine

## 2020-04-08 DIAGNOSIS — H53451 Other localized visual field defect, right eye: Secondary | ICD-10-CM | POA: Diagnosis not present

## 2020-04-08 DIAGNOSIS — H04123 Dry eye syndrome of bilateral lacrimal glands: Secondary | ICD-10-CM | POA: Diagnosis not present

## 2020-04-08 DIAGNOSIS — H33321 Round hole, right eye: Secondary | ICD-10-CM | POA: Diagnosis not present

## 2020-04-08 DIAGNOSIS — H35373 Puckering of macula, bilateral: Secondary | ICD-10-CM | POA: Diagnosis not present

## 2020-04-24 ENCOUNTER — Other Ambulatory Visit (HOSPITAL_COMMUNITY): Payer: Self-pay | Admitting: Family Medicine

## 2020-05-09 ENCOUNTER — Other Ambulatory Visit (HOSPITAL_BASED_OUTPATIENT_CLINIC_OR_DEPARTMENT_OTHER): Payer: Self-pay

## 2020-05-10 ENCOUNTER — Other Ambulatory Visit (HOSPITAL_BASED_OUTPATIENT_CLINIC_OR_DEPARTMENT_OTHER): Payer: Self-pay

## 2020-05-27 MED FILL — Alprazolam Tab 1 MG: ORAL | 30 days supply | Qty: 90 | Fill #0 | Status: AC

## 2020-05-28 ENCOUNTER — Other Ambulatory Visit (HOSPITAL_COMMUNITY): Payer: Self-pay

## 2020-06-06 ENCOUNTER — Other Ambulatory Visit (HOSPITAL_COMMUNITY): Payer: Self-pay

## 2020-06-06 MED ORDER — OXYCODONE-ACETAMINOPHEN 10-325 MG PO TABS
ORAL_TABLET | ORAL | 0 refills | Status: DC
  Filled 2020-06-06: qty 60, 30d supply, fill #0

## 2020-06-07 ENCOUNTER — Other Ambulatory Visit (HOSPITAL_COMMUNITY): Payer: Self-pay

## 2020-06-25 ENCOUNTER — Other Ambulatory Visit (HOSPITAL_COMMUNITY): Payer: Self-pay

## 2020-06-25 DIAGNOSIS — G894 Chronic pain syndrome: Secondary | ICD-10-CM | POA: Diagnosis not present

## 2020-06-25 MED ORDER — ALPRAZOLAM 1 MG PO TABS
ORAL_TABLET | ORAL | 5 refills | Status: DC
  Filled 2020-06-25: qty 90, 30d supply, fill #0
  Filled 2020-08-12: qty 90, 30d supply, fill #1
  Filled 2020-09-15: qty 90, 30d supply, fill #2
  Filled 2020-10-22: qty 90, 30d supply, fill #3
  Filled 2020-11-27: qty 90, 30d supply, fill #4

## 2020-06-25 MED ORDER — GABAPENTIN 100 MG PO CAPS
ORAL_CAPSULE | ORAL | 5 refills | Status: DC
  Filled 2020-06-25: qty 30, 30d supply, fill #0

## 2020-06-27 ENCOUNTER — Other Ambulatory Visit (HOSPITAL_COMMUNITY): Payer: Self-pay

## 2020-07-10 ENCOUNTER — Other Ambulatory Visit (HOSPITAL_COMMUNITY): Payer: Self-pay

## 2020-07-10 MED ORDER — OXYCODONE-ACETAMINOPHEN 10-325 MG PO TABS
ORAL_TABLET | ORAL | 0 refills | Status: DC
  Filled 2020-07-10: qty 60, 30d supply, fill #0

## 2020-07-11 ENCOUNTER — Other Ambulatory Visit (HOSPITAL_COMMUNITY): Payer: Self-pay

## 2020-07-25 ENCOUNTER — Other Ambulatory Visit (HOSPITAL_COMMUNITY): Payer: Self-pay

## 2020-07-25 MED FILL — Escitalopram Oxalate Tab 20 MG (Base Equiv): ORAL | 90 days supply | Qty: 90 | Fill #0 | Status: AC

## 2020-08-12 ENCOUNTER — Other Ambulatory Visit (HOSPITAL_COMMUNITY): Payer: Self-pay

## 2020-08-14 ENCOUNTER — Other Ambulatory Visit (HOSPITAL_COMMUNITY): Payer: Self-pay

## 2020-08-14 MED ORDER — OXYCODONE-ACETAMINOPHEN 10-325 MG PO TABS
ORAL_TABLET | ORAL | 0 refills | Status: DC
  Filled 2020-08-14: qty 60, 30d supply, fill #0

## 2020-09-16 ENCOUNTER — Other Ambulatory Visit (HOSPITAL_COMMUNITY): Payer: Self-pay

## 2020-10-01 ENCOUNTER — Ambulatory Visit (HOSPITAL_COMMUNITY)
Admission: EM | Admit: 2020-10-01 | Discharge: 2020-10-01 | Disposition: A | Discharge status: Home / Self Care | Payer: Medicare Other | Attending: Internal Medicine | Admitting: Internal Medicine

## 2020-10-01 ENCOUNTER — Ambulatory Visit (INDEPENDENT_AMBULATORY_CARE_PROVIDER_SITE_OTHER): Payer: Medicare Other

## 2020-10-01 ENCOUNTER — Other Ambulatory Visit: Payer: Self-pay

## 2020-10-01 ENCOUNTER — Other Ambulatory Visit (HOSPITAL_COMMUNITY): Payer: Self-pay

## 2020-10-01 ENCOUNTER — Encounter (HOSPITAL_COMMUNITY): Payer: Self-pay | Admitting: *Deleted

## 2020-10-01 DIAGNOSIS — M545 Low back pain, unspecified: Secondary | ICD-10-CM | POA: Diagnosis not present

## 2020-10-01 DIAGNOSIS — Z20822 Contact with and (suspected) exposure to covid-19: Secondary | ICD-10-CM | POA: Diagnosis not present

## 2020-10-01 DIAGNOSIS — R058 Other specified cough: Secondary | ICD-10-CM | POA: Diagnosis not present

## 2020-10-01 DIAGNOSIS — R6889 Other general symptoms and signs: Secondary | ICD-10-CM | POA: Insufficient documentation

## 2020-10-01 DIAGNOSIS — B349 Viral infection, unspecified: Secondary | ICD-10-CM | POA: Diagnosis not present

## 2020-10-01 LAB — COMPREHENSIVE METABOLIC PANEL
ALT: 27 U/L (ref 0–44)
AST: 32 U/L (ref 15–41)
Albumin: 4.1 g/dL (ref 3.5–5.0)
Alkaline Phosphatase: 63 U/L (ref 38–126)
Anion gap: 11 (ref 5–15)
BUN: 6 mg/dL — ABNORMAL LOW (ref 8–23)
CO2: 26 mmol/L (ref 22–32)
Calcium: 9.2 mg/dL (ref 8.9–10.3)
Chloride: 103 mmol/L (ref 98–111)
Creatinine, Ser: 0.78 mg/dL (ref 0.44–1.00)
GFR, Estimated: >60 mL/min (ref >60)
Glucose, Bld: 87 mg/dL (ref 70–99)
Potassium: 4.4 mmol/L (ref 3.5–5.1)
Sodium: 140 mmol/L (ref 135–145)
Total Bilirubin: 0.6 mg/dL (ref 0.3–1.2)
Total Protein: 6.6 g/dL (ref 6.5–8.1)

## 2020-10-01 LAB — POCT URINALYSIS DIPSTICK, ED / UC
Bilirubin Urine: NEGATIVE
Glucose, UA: NEGATIVE mg/dL
Hgb urine dipstick: NEGATIVE
Leukocytes,Ua: NEGATIVE
Nitrite: NEGATIVE
Protein, ur: NEGATIVE mg/dL
Specific Gravity, Urine: 1.01 (ref 1.005–1.030)
Urobilinogen, UA: 0.2 mg/dL (ref 0.0–1.0)
pH: 5.5 (ref 5.0–8.0)

## 2020-10-01 LAB — LIPASE, BLOOD: Lipase: 39 U/L (ref 11–51)

## 2020-10-01 LAB — POC INFLUENZA A AND B ANTIGEN (URGENT CARE ONLY)
INFLUENZA A ANTIGEN, POC: NEGATIVE
INFLUENZA B ANTIGEN, POC: NEGATIVE

## 2020-10-01 MED ORDER — OXYCODONE-ACETAMINOPHEN 10-325 MG PO TABS
ORAL_TABLET | ORAL | 0 refills | Status: DC
  Filled 2020-10-01: qty 60, 30d supply, fill #0

## 2020-10-01 NOTE — ED Provider Notes (Signed)
[name]    CSN: [id number] Arrival date & time: 10/01/20  1722      History   Chief Complaint Chief Complaint  Patient presents with   sent by PCP   Chills   Back Pain    HPI [name] is a 65 y.o. female.   Last week started to have congestion and dry cough No shortness of breath, chest pain In the last three days, has been having worsening right sided low back pain Having a sore throat as well Subjective fevers and chills yesterday Woke up yesterday and thought that her congestion was better, however was having severe back pain on the right side of her body when she got up and this caused her to vomit She also had few episodes of diarrhea yesterday States that she has been urinating normally, however the pain in her back is very severe and feels like someone punched her in the back She states that she has not had an appetite for the last 2 days She has been trying to drink, still urinating She denies any illicit drug use, significant alcohol use She states that her grandson was sick last week and she was exposed to him Over the weekend she remained in bed most of the time Reports that her stomach has been feeling "queasy" and having some abdominal pain that is rather diffuse She has not had any episodes of vomiting or diarrhea since yesterday   Past Medical History:  Diagnosis Date   Anxiety    Arthritis    Cataract    Chronic neck and back pain    Depression    GERD (gastroesophageal reflux disease)     Patient Active Problem List   Diagnosis Date Noted   S/P lumbar fusion 07/05/2019    Past Surgical History:  Procedure Laterality Date   ABDOMINAL HYSTERECTOMY     APPENDECTOMY     BACK SURGERY     cervical sx. by Dr. Sherley Bounds   BREAST SURGERY     bilateral breast reduction   CERVICAL SPINE SURGERY     CHOLECYSTECTOMY     EYE SURGERY Bilateral    laser; cataract   FRACTURE SURGERY Left    wrist   left shoulder surgery      LUMBAR LAMINECTOMY/DECOMPRESSION MICRODISCECTOMY N/A 07/05/2019   Procedure: Lumbar two-three decompression and discectomy;  Surgeon: Eustace Moore, MD;  Location: [facility];  Service: Neurosurgery;  Laterality: N/A;   TONSILLECTOMY     WRIST SURGERY      OB History   No obstetric history on file.      Home Medications    Prior to Admission medications   Medication Sig Start Date End Date Taking? Authorizing Provider  ALPRAZolam Duanne Moron) 1 MG tablet Take 1 mg by mouth 3 (three) times daily as needed for anxiety.     [provider]  ALPRAZolam Duanne Moron) 1 MG tablet Take 1 tablet by mouth three times a day as needed (30 days) 06/25/20     APPLE CIDER VINEGAR PO Take 450 mg by mouth 2 (two) times daily.    [provider]  aspirin-sod bicarb-citric acid (ALKA-SELTZER) 325 MG TBEF tablet Take 650 mg by mouth daily as needed (indigestion).    [provider]  Carboxymeth-Glycerin-Polysorb (REFRESH OPTIVE MEGA-3 OP) Place 1 drop into both eyes 5 (five) times daily as needed (dry eyes).    [provider]  cephALEXin (KEFLEX) 500 MG capsule Take 1 capsule (500 mg  total) by mouth 4 (four) times daily. 09/30/19   Jacqlyn Larsen, PA-C  Cholecalciferol (DIALYVITE VITAMIN D 5000) 125 MCG (5000 UT) capsule Take 5,000 Units by mouth in the morning and at bedtime.    [provider]  cyclobenzaprine (FLEXERIL) 10 MG tablet Take 10 mg by mouth at bedtime as needed for muscle spasms.    [provider]  cyclobenzaprine (FLEXERIL) 10 MG tablet TAKE 1 TABLET BY MOUTH THREE TIMES DAILY AS NEEDED 12/27/19 12/26/20  Shirline Frees, MD  escitalopram (LEXAPRO) 20 MG tablet Take 20 mg by mouth daily. 07/02/17   [provider]  escitalopram (LEXAPRO) 20 MG tablet TAKE 1 TABLET BY MOUTH ONCE A DAY 12/27/19 12/26/20  Shirline Frees, MD  Flaxseed, Linseed, (FLAX SEED OIL) 1000 MG CAPS Take 1,000 mg by mouth daily.    [provider]  gabapentin  (NEURONTIN) 100 MG capsule Take 100 mg by mouth at bedtime as needed (back pain).    [provider]  gabapentin (NEURONTIN) 100 MG capsule Take 1 capsule by mouth Once a day at bedtime for pain 30 day(s) 06/25/20     Misc Natural Products (NEURIVA) CAPS Take 1 capsule by mouth daily.    [provider]  Multiple Vitamin (MULTIVITAMIN WITH MINERALS) TABS tablet Take 1 tablet by mouth daily. Centrum    [provider]  nabumetone (RELAFEN) 500 MG tablet Take 500 mg by mouth 2 (two) times daily as needed for moderate pain.    [provider]  nabumetone (RELAFEN) 500 MG tablet TAKE 1 TABLET BY MOUTH TWO TIMES DAILY AS NEEDED 12/27/19 12/26/20  Shirline Frees, MD  ondansetron (ZOFRAN) 8 MG tablet TAKE 1 TABLET BY MOUTH 3 TIMES DAILY AS NEEDED FOR NAUSEA FOR 10 DAYS 02/27/20 02/26/21  Shirline Frees, MD  oxyCODONE-acetaminophen (PERCOCET) 10-325 MG per tablet Take 0.5-1 tablets by mouth 3 (three) times daily as needed for pain.     [provider]  oxyCODONE-acetaminophen (PERCOCET) 10-325 MG tablet TAKE 1 TABLET BY MOUTH 2 TIMES DAILY 04/24/20 10/21/20  Shirline Frees, MD  oxyCODONE-acetaminophen (PERCOCET) 10-325 MG tablet Take 1 tablet by mouth twice a day 06/06/20     oxyCODONE-acetaminophen (PERCOCET) 10-325 MG tablet Take 1 tablet by mouth twice a day 07/10/20     oxyCODONE-acetaminophen (PERCOCET) 10-325 MG tablet Take 1 tablet by mouth twice a day. 10/01/20     Probiotic Product (ALIGN) 4 MG CAPS Take 4 mg by mouth daily.    [provider]  Turmeric 500 MG CAPS Take 500 mg by mouth 2 (two) times daily.    [provider]    Family History Family History  Problem Relation Age of Onset   Colon cancer Neg Hx    Esophageal cancer Neg Hx    Pancreatic cancer Neg Hx    Rectal cancer Neg Hx    Stomach cancer Neg Hx     Social History Social History   Tobacco Use   Smoking status: Every Day   Smokeless tobacco: Never   Tobacco  comments:    Quit in 2015  Vaping Use   Vaping Use: Never used  Substance Use Topics   Drug use: No     Allergies   Codeine, Sertraline, and Prednisone   Review of Systems Review of Systems  Constitutional:  Positive for appetite change, chills and fatigue.  HENT:  Positive for congestion and sore throat. Negative for ear pain and trouble swallowing.   Eyes:  Negative for pain.  Respiratory:  Positive for cough. Negative for shortness of breath.   Cardiovascular:  Negative for chest pain and leg swelling.  Gastrointestinal:  Positive for abdominal pain, diarrhea, nausea and vomiting. Negative for blood in stool.  Genitourinary:  Positive for flank pain. Negative for difficulty urinating, dysuria and hematuria.  Musculoskeletal:  Positive for back pain. Negative for myalgias and neck pain.  Skin:  Negative for rash.  Neurological:  Negative for headaches.    Physical Exam Triage Vital Signs ED Triage Vitals  Enc Vitals Group     BP 10/01/20 1749 (!) 169/83     Pulse Rate 10/01/20 1749 74     Resp 10/01/20 1749 20     Temp 10/01/20 1749 98.4 F (36.9 C)     Temp src --      SpO2 10/01/20 1749 96 %     Weight --      Height --      Head Circumference --      Peak Flow --      Pain Score 10/01/20 1752 9     Pain Loc --      Pain Edu? --      Excl. in [place]? --    No data found.  Updated Vital Signs BP (!) 169/83   Pulse 74   Temp 98.4 F (36.9 C)   Resp 20   SpO2 96%   Visual Acuity Right Eye Distance:   Left Eye Distance:   Bilateral Distance:    Right Eye Near:   Left Eye Near:    Bilateral Near:     Physical Exam Constitutional:      General: She is not in acute distress.    Appearance: Normal appearance. She is not ill-appearing or toxic-appearing.  HENT:     Head: Normocephalic and atraumatic.     Nose: Nose normal.     Mouth/Throat:     Mouth: Mucous membranes are moist.     Pharynx: No oropharyngeal exudate or posterior oropharyngeal  erythema.  Eyes:     Extraocular Movements: Extraocular movements intact.     Pupils: Pupils are equal, round, and reactive to light.  Cardiovascular:     Rate and Rhythm: Normal rate and regular rhythm.  Pulmonary:     Effort: Pulmonary effort is normal.     Breath sounds: Normal breath sounds. No wheezing, rhonchi or rales.  Abdominal:     Palpations: Abdomen is soft.     Tenderness: There is abdominal tenderness (mild diffuse). There is no right CVA tenderness or left CVA tenderness.  Musculoskeletal:     Cervical back: Normal range of motion and neck supple. No rigidity or tenderness.     Right lower leg: No edema.     Left lower leg: No edema.     Comments: TTP right paraspinal musculature and in region just superior to right iliac crest, no TTP overlying right posterior ribs or just inferior in region of CVA  Lymphadenopathy:     Cervical: No cervical adenopathy.  Skin:    General: Skin is warm and dry.     Capillary Refill: Capillary refill takes less than 2 seconds.  Neurological:     General: No focal deficit present.     Mental Status: She is alert and oriented to person, place, and time.  Psychiatric:        Mood and Affect: Mood normal.        Behavior: Behavior normal.  Thought Content: Thought content normal.        Judgment: Judgment normal.     UC Treatments / Results  Labs (all labs ordered are listed, but only abnormal results are displayed) Labs Reviewed  POCT URINALYSIS DIPSTICK, ED / UC - Abnormal; Notable for the following components:      Result Value   Ketones, ur TRACE (*)    All other components within normal limits  URINE CULTURE  SARS CORONAVIRUS 2 (TAT 6-24 HRS)  COMPREHENSIVE METABOLIC PANEL  LIPASE, BLOOD  POC INFLUENZA A AND B ANTIGEN (URGENT CARE ONLY)    EKG   Radiology DG Lumbar Spine 2-3 Views  Result Date: 10/01/2020 CLINICAL DATA:  Right-sided back pain for several days, initial encounter EXAM: LUMBAR SPINE - 2 VIEW  COMPARISON:  12/28/2019 FINDINGS: Five lumbar type vertebral bodies are well visualized. Prior interbody fusion with posterior fixation is noted at L4-5 stable in appearance from the prior exam. Mild osteophytic changes are seen. No soft tissue abnormality is noted. 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 No acute abnormality noted. Electronically Signed   By: Inez Catalina M.D.   On: 10/01/2020 19:03    Procedures Procedures (including critical care time)  Medications Ordered in UC Medications - No data to display  Initial Impression / Assessment and Plan / UC Course  I have reviewed the triage vital signs and the nursing notes.  Pertinent labs & imaging results that were available during my care of the patient were reviewed by me and considered in my medical decision making (see chart for details).     Patient is a 65 year old female with a prior history of lumbar fusion who presents with 1 week of congestion, dry cough, overall feeling unwell, and 3 days of right-sided low back pain, and 1 day of chills, subjective fever, nausea, vomiting, diarrhea, decreased appetite.  Her vital signs are stable aside from hypertension which is asymptomatic.  She does have some tenderness palpation in the right lower back with this does not seem to be in the region of CVA tenderness.  We will obtain urinalysis and urine culture to rule out possible pyelonephritis however.  Suspect that this pain is likely musculoskeletal related to multiple episodes of coughing throughout the last week, but will obtain lumbar x-rays to rule out further deformity from her lumbar fusion.  She is not having any red flag symptoms including saddle anesthesias or changes in bowel or bladder habits.  Lungs are clear on exam and O2 sats are stable on room air, no indication for chest x-ray at this time.  She will be tested for viral illnesses including flu and COVID.  We will also obtain CMP and lipase  given some abdominal symptoms.  Urinalysis with only trace ketones likely from decreased PO intake.  Flu also negative.  Lumbar XR without acute changes noted.  We will follow-up urine culture.  We will also follow-up CMP, lipase, COVID testing.  She was advised to follow-up with PCP for further management.  Discussed ED precautions including severe worsening of symptoms, inability to tolerate p.o., decreased urine output, Development of chest pain or difficulty breathing.  She was discharged home in stable condition. Final Clinical Impressions(s) / UC Diagnoses   Final diagnoses:  Flu-like symptoms  Acute right-sided low back pain without sciatica     Discharge Instructions      Your flu test was negative.  Your blood work and your COVID test should be back tomorrow.  If you have significant worsening of your symptoms, have difficulty breathing, chest pain, you are not able to tolerate anything by mouth, you are not urinating normally, you have severe worsening of your pain, you should go to the emergency room right away.  You should follow-up with your primary care provider in the next few days if you are not feeling better.  Continue to stay well-hydrated.  Until your COVID test comes back, you should wear a mask and stay home.  Your back x-ray did not show any recent changes, it is likely that you strained a muscle from coughing.  You can take over-the-counter cough medicine for this, honey is actually the best cough medicine.  In regards to your blood pressure, since it is elevated today, you should keep an eye on this and if you start having chest pain, difficulty breathing, new or changing back pain, difficulty urinating, weakness on one side of body or the other, facial droop, difficulty speaking, he should be seen at the emergency room right away.     ED Prescriptions   None    PDMP not reviewed this encounter.   Cleophas Dunker, DO 10/01/20 1945

## 2020-10-01 NOTE — Discharge Instructions (Addendum)
Your flu test was negative.  Your blood work and your COVID test should be back tomorrow.  If you have significant worsening of your symptoms, have difficulty breathing, chest pain, you are not able to tolerate anything by mouth, you are not urinating normally, you have severe worsening of your pain, you should go to the emergency room right away.  You should follow-up with your primary care provider in the next few days if you are not feeling better.  Continue to stay well-hydrated.  Until your COVID test comes back, you should wear a mask and stay home.  Your back x-ray did not show any recent changes, it is likely that you strained a muscle from coughing.  You can take over-the-counter cough medicine for this, honey is actually the best cough medicine.  In regards to your blood pressure, since it is elevated today, you should keep an eye on this and if you start having chest pain, difficulty breathing, new or changing back pain, difficulty urinating, weakness on one side of body or the other, facial droop, difficulty speaking, he should be seen at the emergency room right away.

## 2020-10-01 NOTE — ED Triage Notes (Signed)
Pt reports sent by PCP for RSV  and flu test. Pt reports vomiting with back pain.

## 2020-10-02 ENCOUNTER — Other Ambulatory Visit (HOSPITAL_COMMUNITY): Payer: Self-pay

## 2020-10-02 LAB — SARS CORONAVIRUS 2 (TAT 6-24 HRS): SARS Coronavirus 2: NEGATIVE

## 2020-10-02 MED FILL — Ondansetron HCl Tab 8 MG: ORAL | 10 days supply | Qty: 30 | Fill #0 | Status: AC

## 2020-10-03 LAB — URINE CULTURE: Culture: 50000 — AB

## 2020-10-23 ENCOUNTER — Other Ambulatory Visit (HOSPITAL_COMMUNITY): Payer: Self-pay

## 2020-10-30 ENCOUNTER — Other Ambulatory Visit (HOSPITAL_COMMUNITY): Payer: Self-pay

## 2020-10-30 MED FILL — Escitalopram Oxalate Tab 20 MG (Base Equiv): ORAL | 90 days supply | Qty: 90 | Fill #1 | Status: AC

## 2020-11-08 ENCOUNTER — Other Ambulatory Visit (HOSPITAL_COMMUNITY): Payer: Self-pay

## 2020-11-08 MED ORDER — OXYCODONE-ACETAMINOPHEN 10-325 MG PO TABS
ORAL_TABLET | ORAL | 0 refills | Status: DC
  Filled 2020-11-08: qty 60, 30d supply, fill #0

## 2020-11-27 ENCOUNTER — Other Ambulatory Visit (HOSPITAL_COMMUNITY): Payer: Self-pay

## 2020-12-16 DIAGNOSIS — Z1231 Encounter for screening mammogram for malignant neoplasm of breast: Secondary | ICD-10-CM | POA: Diagnosis not present

## 2020-12-19 ENCOUNTER — Other Ambulatory Visit (HOSPITAL_COMMUNITY): Payer: Self-pay

## 2020-12-19 MED ORDER — OXYCODONE-ACETAMINOPHEN 10-325 MG PO TABS
ORAL_TABLET | ORAL | 0 refills | Status: DC
  Filled 2020-12-19: qty 60, 30d supply, fill #0

## 2020-12-20 ENCOUNTER — Other Ambulatory Visit (HOSPITAL_COMMUNITY): Payer: Self-pay

## 2020-12-27 ENCOUNTER — Other Ambulatory Visit (HOSPITAL_COMMUNITY): Payer: Self-pay

## 2020-12-27 DIAGNOSIS — Z23 Encounter for immunization: Secondary | ICD-10-CM | POA: Diagnosis not present

## 2020-12-27 DIAGNOSIS — G894 Chronic pain syndrome: Secondary | ICD-10-CM | POA: Diagnosis not present

## 2020-12-27 DIAGNOSIS — Z0000 Encounter for general adult medical examination without abnormal findings: Secondary | ICD-10-CM | POA: Diagnosis not present

## 2020-12-27 MED ORDER — ALPRAZOLAM 1 MG PO TABS
ORAL_TABLET | ORAL | 5 refills | Status: DC
  Filled 2020-12-27: qty 90, 30d supply, fill #0
  Filled 2021-01-30: qty 90, 30d supply, fill #1
  Filled 2021-03-05: qty 90, 30d supply, fill #2
  Filled 2021-04-07: qty 90, 30d supply, fill #3
  Filled 2021-05-05: qty 90, 30d supply, fill #4
  Filled 2021-06-09: qty 90, 30d supply, fill #5

## 2020-12-27 MED ORDER — CYCLOBENZAPRINE HCL 10 MG PO TABS
ORAL_TABLET | ORAL | 6 refills | Status: DC
  Filled 2020-12-27: qty 30, 30d supply, fill #0
  Filled 2021-06-09: qty 30, 30d supply, fill #1
  Filled 2021-08-03: qty 30, 30d supply, fill #2
  Filled 2021-08-28: qty 30, 30d supply, fill #3
  Filled 2021-10-29: qty 30, 30d supply, fill #4
  Filled 2021-11-23: qty 30, 30d supply, fill #5

## 2020-12-30 ENCOUNTER — Other Ambulatory Visit (HOSPITAL_COMMUNITY): Payer: Self-pay

## 2021-01-14 DIAGNOSIS — Z780 Asymptomatic menopausal state: Secondary | ICD-10-CM | POA: Diagnosis not present

## 2021-01-14 DIAGNOSIS — M85852 Other specified disorders of bone density and structure, left thigh: Secondary | ICD-10-CM | POA: Diagnosis not present

## 2021-01-20 ENCOUNTER — Other Ambulatory Visit (HOSPITAL_COMMUNITY): Payer: Self-pay

## 2021-01-20 MED ORDER — OXYCODONE-ACETAMINOPHEN 10-325 MG PO TABS
ORAL_TABLET | ORAL | 0 refills | Status: DC
  Filled 2021-01-20: qty 60, 30d supply, fill #0

## 2021-01-21 ENCOUNTER — Other Ambulatory Visit (HOSPITAL_COMMUNITY): Payer: Self-pay

## 2021-01-30 ENCOUNTER — Other Ambulatory Visit (HOSPITAL_COMMUNITY): Payer: Self-pay

## 2021-01-31 ENCOUNTER — Other Ambulatory Visit (HOSPITAL_COMMUNITY): Payer: Self-pay

## 2021-01-31 MED ORDER — ESCITALOPRAM OXALATE 20 MG PO TABS
ORAL_TABLET | ORAL | 1 refills | Status: DC
  Filled 2021-01-31: qty 90, 90d supply, fill #0
  Filled 2021-05-05: qty 90, 90d supply, fill #1

## 2021-02-18 ENCOUNTER — Other Ambulatory Visit (HOSPITAL_COMMUNITY): Payer: Self-pay

## 2021-02-18 MED ORDER — OXYCODONE-ACETAMINOPHEN 10-325 MG PO TABS
ORAL_TABLET | ORAL | 0 refills | Status: DC
  Filled 2021-02-18: qty 60, 30d supply, fill #0

## 2021-02-19 ENCOUNTER — Other Ambulatory Visit (HOSPITAL_COMMUNITY): Payer: Self-pay

## 2021-02-20 ENCOUNTER — Other Ambulatory Visit (HOSPITAL_COMMUNITY): Payer: Self-pay

## 2021-02-24 ENCOUNTER — Other Ambulatory Visit (HOSPITAL_COMMUNITY): Payer: Self-pay

## 2021-02-24 DIAGNOSIS — U071 COVID-19: Secondary | ICD-10-CM | POA: Diagnosis not present

## 2021-02-24 MED ORDER — PAXLOVID (300/100) 20 X 150 MG & 10 X 100MG PO TBPK
ORAL_TABLET | ORAL | 0 refills | Status: DC
  Filled 2021-02-24 (×2): qty 30, 5d supply, fill #0

## 2021-03-05 ENCOUNTER — Other Ambulatory Visit (HOSPITAL_COMMUNITY): Payer: Self-pay

## 2021-03-06 ENCOUNTER — Other Ambulatory Visit (HOSPITAL_COMMUNITY): Payer: Self-pay

## 2021-03-08 ENCOUNTER — Other Ambulatory Visit (HOSPITAL_COMMUNITY): Payer: Self-pay

## 2021-03-27 ENCOUNTER — Other Ambulatory Visit (HOSPITAL_COMMUNITY): Payer: Self-pay

## 2021-03-27 MED ORDER — OXYCODONE-ACETAMINOPHEN 10-325 MG PO TABS
ORAL_TABLET | ORAL | 0 refills | Status: DC
  Filled 2021-03-27: qty 60, 30d supply, fill #0

## 2021-04-07 ENCOUNTER — Other Ambulatory Visit (HOSPITAL_COMMUNITY): Payer: Self-pay

## 2021-04-09 DIAGNOSIS — H5213 Myopia, bilateral: Secondary | ICD-10-CM | POA: Diagnosis not present

## 2021-04-25 ENCOUNTER — Other Ambulatory Visit (HOSPITAL_COMMUNITY): Payer: Self-pay

## 2021-04-25 MED ORDER — OXYCODONE-ACETAMINOPHEN 10-325 MG PO TABS
ORAL_TABLET | ORAL | 0 refills | Status: DC
  Filled 2021-04-25: qty 60, 30d supply, fill #0

## 2021-04-28 ENCOUNTER — Other Ambulatory Visit (HOSPITAL_COMMUNITY): Payer: Self-pay

## 2021-04-29 ENCOUNTER — Other Ambulatory Visit (HOSPITAL_COMMUNITY): Payer: Self-pay

## 2021-05-05 ENCOUNTER — Other Ambulatory Visit (HOSPITAL_COMMUNITY): Payer: Self-pay

## 2021-05-27 ENCOUNTER — Other Ambulatory Visit (HOSPITAL_COMMUNITY): Payer: Self-pay

## 2021-05-27 MED ORDER — OXYCODONE-ACETAMINOPHEN 10-325 MG PO TABS
ORAL_TABLET | ORAL | 0 refills | Status: DC
  Filled 2021-05-27: qty 60, 30d supply, fill #0

## 2021-06-10 ENCOUNTER — Other Ambulatory Visit (HOSPITAL_COMMUNITY): Payer: Self-pay

## 2021-06-30 ENCOUNTER — Other Ambulatory Visit (HOSPITAL_COMMUNITY): Payer: Self-pay

## 2021-06-30 DIAGNOSIS — G894 Chronic pain syndrome: Secondary | ICD-10-CM | POA: Diagnosis not present

## 2021-06-30 MED ORDER — GABAPENTIN 100 MG PO CAPS
ORAL_CAPSULE | ORAL | 3 refills | Status: DC
  Filled 2021-06-30: qty 90, 90d supply, fill #0
  Filled 2021-11-26: qty 90, 90d supply, fill #1
  Filled 2022-02-20: qty 90, 90d supply, fill #2

## 2021-06-30 MED ORDER — OXYCODONE-ACETAMINOPHEN 10-325 MG PO TABS
ORAL_TABLET | ORAL | 0 refills | Status: DC
  Filled 2021-06-30: qty 60, 30d supply, fill #0

## 2021-06-30 MED ORDER — ALPRAZOLAM 1 MG PO TABS
ORAL_TABLET | ORAL | 5 refills | Status: DC
  Filled 2021-06-30 – 2021-07-11 (×2): qty 90, 30d supply, fill #0
  Filled 2021-08-19: qty 27, 9d supply, fill #1
  Filled 2021-08-20: qty 90, 30d supply, fill #1
  Filled 2021-09-21: qty 90, 30d supply, fill #2
  Filled 2021-10-24: qty 90, 30d supply, fill #3
  Filled 2021-11-25: qty 90, 30d supply, fill #4

## 2021-07-11 ENCOUNTER — Other Ambulatory Visit (HOSPITAL_COMMUNITY): Payer: Self-pay

## 2021-07-24 ENCOUNTER — Encounter (HOSPITAL_COMMUNITY): Payer: Self-pay

## 2021-07-24 ENCOUNTER — Ambulatory Visit (HOSPITAL_COMMUNITY)
Admission: EM | Admit: 2021-07-24 | Discharge: 2021-07-24 | Disposition: A | Discharge status: Home / Self Care | Payer: Medicare Other | Attending: Student | Admitting: Student

## 2021-07-24 DIAGNOSIS — R030 Elevated blood-pressure reading, without diagnosis of hypertension: Secondary | ICD-10-CM

## 2021-07-24 DIAGNOSIS — L255 Unspecified contact dermatitis due to plants, except food: Secondary | ICD-10-CM | POA: Diagnosis not present

## 2021-07-24 MED ORDER — METHYLPREDNISOLONE SODIUM SUCC 125 MG IJ SOLR
40.0000 mg | Freq: Once | INTRAMUSCULAR | Status: AC
Start: 2021-07-24 — End: 2021-07-24
  Administered 2021-07-24: 40 mg via INTRAMUSCULAR

## 2021-07-24 MED ORDER — HYDROXYZINE HCL 25 MG PO TABS: 25.0000 mg | ORAL_TABLET | Freq: Four times a day (QID) | ORAL | 0 refills | Status: DC | PRN

## 2021-07-24 MED ORDER — METHYLPREDNISOLONE SODIUM SUCC 125 MG IJ SOLR
INTRAMUSCULAR | Status: AC
  Filled 2021-07-24: qty 2

## 2021-07-24 MED ORDER — PREDNISONE 20 MG PO TABS: 20.0000 mg | ORAL_TABLET | Freq: Every day | ORAL | 0 refills | Status: AC

## 2021-07-24 NOTE — ED Triage Notes (Signed)
Pt states worked out in her daughters yard on Tuesday and developed red itchy patches to rt hand. No has red itchy areas to rt hand/arm/neck/face. Took OTC meds with no relief. No difficulty swallowing/breathing.

## 2021-07-24 NOTE — ED Provider Notes (Signed)
[place]    CSN: [id number] Arrival date & time: 07/24/21  1531      History   Chief Complaint Chief Complaint  Patient presents with   Allergic Reaction    HPI [name] is a 66 y.o. female presenting with poison ivy for 2 days.  History of anxiety.  Patient states that she thinks she came into contact with poison ivy 2 days ago, itchy rash started on her right forearm and has spread to her face with few lesions on the left arm as well.  She states she has tried various over-the-counter medications without relief.  She emphasizes that she is very uncomfortable and feels anxious because of this.  She states that she does not take any medications for her blood pressure, this is typically in normal range.  She denies current headaches, dizziness, chest pain, shortness of breath. States she will take a xanax when she returns home, which will resolve her anxiety.  HPI  Past Medical History:  Diagnosis Date   Anxiety    Arthritis    Cataract    Chronic neck and back pain    Depression    GERD (gastroesophageal reflux disease)     Patient Active Problem List   Diagnosis Date Noted   S/P lumbar fusion 07/05/2019    Past Surgical History:  Procedure Laterality Date   ABDOMINAL HYSTERECTOMY     APPENDECTOMY     BACK SURGERY     cervical sx. by Dr. Sherley Bounds   BREAST SURGERY     bilateral breast reduction   CERVICAL SPINE SURGERY     CHOLECYSTECTOMY     EYE SURGERY Bilateral    laser; cataract   FRACTURE SURGERY Left    wrist   left shoulder surgery     LUMBAR LAMINECTOMY/DECOMPRESSION MICRODISCECTOMY N/A 07/05/2019   Procedure: Lumbar two-three decompression and discectomy;  Surgeon: Eustace Moore, MD;  Location: [facility];  Service: Neurosurgery;  Laterality: N/A;   TONSILLECTOMY     WRIST SURGERY      OB History   No obstetric history on file.      Home Medications    Prior to Admission medications   Medication Sig Start Date End Date  Taking? Authorizing Provider  hydrOXYzine (ATARAX) 25 MG tablet Take 1 tablet (25 mg total) by mouth every 6 (six) hours as needed. 07/24/21  Yes Hazel Sams, PA-C  predniSONE (DELTASONE) 20 MG tablet Take 1 tablet (20 mg total) by mouth daily for 3 days. Take with breakfast or lunch. Avoid NSAIDs (ibuprofen, etc) while taking this medication. 07/25/21 07/28/21 Yes Hazel Sams, PA-C  ALPRAZolam Duanne Moron) 1 MG tablet Take 1 mg by mouth 3 (three) times daily as needed for anxiety.     [provider]  ALPRAZolam Duanne Moron) 1 MG tablet Take 1 tablet by mouth 3 times a day as needed 30 days 06/30/21     APPLE CIDER VINEGAR PO Take 450 mg by mouth 2 (two) times daily.    [provider]  aspirin-sod bicarb-citric acid (ALKA-SELTZER) 325 MG TBEF tablet Take 650 mg by mouth daily as needed (indigestion).    [provider]  Carboxymeth-Glycerin-Polysorb (REFRESH OPTIVE MEGA-3 OP) Place 1 drop into both eyes 5 (five) times daily as needed (dry eyes).    [provider]  cephALEXin (KEFLEX) 500 MG capsule Take 1 capsule (500 mg total) by mouth 4 (four) times daily. 09/30/19   Jacqlyn Larsen, PA-C  Cholecalciferol (  DIALYVITE VITAMIN D 5000) 125 MCG (5000 UT) capsule Take 5,000 Units by mouth in the morning and at bedtime.    [provider]  cyclobenzaprine (FLEXERIL) 10 MG tablet Take 10 mg by mouth at bedtime as needed for muscle spasms.    [provider]  cyclobenzaprine (FLEXERIL) 10 MG tablet Take 1 tablet by mouth once a day at bedtime as needed for muscle spasm. 12/27/20     escitalopram (LEXAPRO) 20 MG tablet Take 20 mg by mouth daily. 07/02/17   [provider]  escitalopram (LEXAPRO) 20 MG tablet TAKE 1 TABLET BY MOUTH ONCE A DAY 12/27/19 12/26/20  Shirline Frees, MD  escitalopram (LEXAPRO) 20 MG tablet Take 1 tablet by mouth once a day 01/31/21     Flaxseed, Linseed, (FLAX SEED OIL) 1000 MG CAPS Take 1,000 mg by mouth daily.    [provider]  gabapentin (NEURONTIN) 100 MG capsule Take 100 mg by mouth at bedtime as needed (back pain).    [provider]  gabapentin (NEURONTIN) 100 MG capsule Take 1 capsule by mouth Once a day at bedtime for pain 30 day(s) 06/25/20     gabapentin (NEURONTIN) 100 MG capsule Take 1 capsule by mouth once daily at bedtime for pain 06/30/21     Misc Natural Products (NEURIVA) CAPS Take 1 capsule by mouth daily.    [provider]  Multiple Vitamin (MULTIVITAMIN WITH MINERALS) TABS tablet Take 1 tablet by mouth daily. Centrum    [provider]  nabumetone (RELAFEN) 500 MG tablet Take 500 mg by mouth 2 (two) times daily as needed for moderate pain.    [provider]  oxyCODONE-acetaminophen (PERCOCET) 10-325 MG per tablet Take 0.5-1 tablets by mouth 3 (three) times daily as needed for pain.     [provider]  Probiotic Product (ALIGN) 4 MG CAPS Take 4 mg by mouth daily.    [provider]  Turmeric 500 MG CAPS Take 500 mg by mouth 2 (two) times daily.    [provider]    Family History Family History  Problem Relation Age of Onset   Colon cancer Neg Hx    Esophageal cancer Neg Hx    Pancreatic cancer Neg Hx    Rectal cancer Neg Hx    Stomach cancer Neg Hx     Social History Social History   Tobacco Use   Smoking status: Every Day   Smokeless tobacco: Never   Tobacco comments:    Quit in 2015  Vaping Use   Vaping Use: Never used  Substance Use Topics   Drug use: No     Allergies   Codeine, Sertraline, and Prednisone   Review of Systems Review of Systems  Skin:  Positive for rash.  All other systems reviewed and are negative.    Physical Exam Triage Vital Signs ED Triage Vitals  Enc Vitals Group     BP 07/24/21 1552 (!) 180/85     Pulse Rate 07/24/21 1552 85     Resp 07/24/21 1552 18     Temp 07/24/21 1552 98.2 F (36.8 C)     Temp Source 07/24/21 1552 Oral     SpO2 07/24/21 1552 96 %      Weight --      Height --      Head Circumference --      Peak Flow --      Pain Score 07/24/21 1553 0     Pain Loc --  Pain Edu? --      Excl. in [place]? --    No data found.  Updated Vital Signs BP (!) 180/85 (BP Location: Left Arm)   Pulse 85   Temp 98.2 F (36.8 C) (Oral)   Resp 18   SpO2 96%   Visual Acuity Right Eye Distance:   Left Eye Distance:   Bilateral Distance:    Right Eye Near:   Left Eye Near:    Bilateral Near:     Physical Exam Vitals reviewed.  Constitutional:      General: She is not in acute distress.    Appearance: Normal appearance. She is not ill-appearing.  HENT:     Head: Normocephalic and atraumatic.  Pulmonary:     Effort: Pulmonary effort is normal.  Skin:    Comments: R forearm and face with urticarial rash, R arm with excoriations throughout. Few satellite lesions on the L forearm. No eyelid or eye involvement. No facial, lip, tongue, uvula swelling.   Neurological:     General: No focal deficit present.     Mental Status: She is alert and oriented to person, place, and time.  Psychiatric:        Mood and Affect: Mood normal.        Behavior: Behavior normal.        Thought Content: Thought content normal.        Judgment: Judgment normal.      UC Treatments / Results  Labs (all labs ordered are listed, but only abnormal results are displayed) Labs Reviewed - No data to display  EKG   Radiology No results found.  Procedures Procedures (including critical care time)  Medications Ordered in UC Medications  methylPREDNISolone sodium succinate (SOLU-MEDROL) 125 mg/2 mL injection 40 mg (has no administration in time range)    Initial Impression / Assessment and Plan / UC Course  I have reviewed the triage vital signs and the nursing notes.  Pertinent labs & imaging results that were available during my care of the patient were reviewed by me and considered in my medical decision making (see chart for details).      This patient is a very pleasant 66 y.o. year old female presenting with urticarial rash / contact dermatitis.  She is hypertensive at 180/85, she does not have a diagnosis of hypertension and does not take any medications for this.  She attributes the elevated systolic blood pressure to anxiety and discomfort, denies current headaches, dizziness, vision changes, chest pain, shortness of breath.  I advised against prednisone for the poison ivy given systolic blood pressure; she verbalizes understanding, but states that she is willing to accept the risks (stroke, MI, ACS) of elevated blood pressure for relief.  Will administer 40 mg of IM Solu-Medrol during visit, and 20 mg of prednisone p.o. sent to start tomorrow x3 days.  Also sent hydroxyzine.  She will monitor her blood pressure at home, and verbalizes she will take a xanax upon returning home. She is not a diabetic. ED return precautions discussed. Patient verbalizes understanding and agreement.    Final Clinical Impressions(s) / UC Diagnoses   Final diagnoses:  Rhus dermatitis  Elevated systolic blood pressure reading without diagnosis of hypertension     Discharge Instructions      -Hydroxyzine as needed for itching, up to every 6 hours. This medication will make you drowsy, so avoid before driving or operating machinery. Do not drink alcohol while taking this medication.  -Avoid other antihistamines that  will make you drowsy while taking this medication, like benedryl. -Starting tomorrow - prednisone one pill daily with breakfast  -The prednisone we administered today, as well as the prednisone I sent for tomorrow might make you anxious and might make your blood pressure go up. -Please check your blood pressure at home or at the pharmacy. If this continues to be >140/90, stop the prednisone, and follow-up with your primary care provider for further blood pressure management/ medication titration. If you develop chest pain, shortness of  breath, vision changes, the worst headache of your life- head straight to the ED or call 911.    ED Prescriptions     Medication Sig Dispense Auth. Provider   predniSONE (DELTASONE) 20 MG tablet Take 1 tablet (20 mg total) by mouth daily for 3 days. Take with breakfast or lunch. Avoid NSAIDs (ibuprofen, etc) while taking this medication. 3 tablet Hazel Sams, PA-C   hydrOXYzine (ATARAX) 25 MG tablet Take 1 tablet (25 mg total) by mouth every 6 (six) hours as needed. 12 tablet Hazel Sams, PA-C      PDMP not reviewed this encounter.   Hazel Sams, PA-C 07/24/21 1614

## 2021-07-24 NOTE — Discharge Instructions (Addendum)
-  Hydroxyzine as needed for itching, up to every 6 hours. This medication will make you drowsy, so avoid before driving or operating machinery. Do not drink alcohol while taking this medication.  -Avoid other antihistamines that will make you drowsy while taking this medication, like benedryl. -Starting tomorrow - prednisone one pill daily with breakfast  -The prednisone we administered today, as well as the prednisone I sent for tomorrow might make you anxious and might make your blood pressure go up. -Please check your blood pressure at home or at the pharmacy. If this continues to be >140/90, stop the prednisone, and follow-up with your primary care provider for further blood pressure management/ medication titration. If you develop chest pain, shortness of breath, vision changes, the worst headache of your life- head straight to the ED or call 911.

## 2021-08-01 ENCOUNTER — Other Ambulatory Visit (HOSPITAL_COMMUNITY): Payer: Self-pay

## 2021-08-01 MED ORDER — OXYCODONE-ACETAMINOPHEN 10-325 MG PO TABS
1.0000 | ORAL_TABLET | Freq: Two times a day (BID) | ORAL | 0 refills | Status: DC
  Filled 2021-08-01: qty 60, 30d supply, fill #0

## 2021-08-03 ENCOUNTER — Other Ambulatory Visit (HOSPITAL_COMMUNITY): Payer: Self-pay

## 2021-08-04 ENCOUNTER — Other Ambulatory Visit (HOSPITAL_COMMUNITY): Payer: Self-pay

## 2021-08-04 MED ORDER — ESCITALOPRAM OXALATE 20 MG PO TABS
20.0000 mg | ORAL_TABLET | Freq: Every day | ORAL | 1 refills | Status: DC
  Filled 2021-08-04: qty 90, 90d supply, fill #0
  Filled 2022-05-06: qty 90, 90d supply, fill #1

## 2021-08-05 ENCOUNTER — Other Ambulatory Visit (HOSPITAL_COMMUNITY): Payer: Self-pay

## 2021-08-05 MED ORDER — ESCITALOPRAM OXALATE 20 MG PO TABS
20.0000 mg | ORAL_TABLET | Freq: Every day | ORAL | 1 refills | Status: DC
  Filled 2021-08-05 – 2021-10-29 (×3): qty 90, 90d supply, fill #0
  Filled 2022-02-04: qty 90, 90d supply, fill #1

## 2021-08-20 ENCOUNTER — Other Ambulatory Visit (HOSPITAL_COMMUNITY): Payer: Self-pay

## 2021-08-26 ENCOUNTER — Other Ambulatory Visit (HOSPITAL_COMMUNITY): Payer: Self-pay

## 2021-08-28 ENCOUNTER — Other Ambulatory Visit (HOSPITAL_COMMUNITY): Payer: Self-pay

## 2021-09-02 ENCOUNTER — Other Ambulatory Visit (HOSPITAL_COMMUNITY): Payer: Self-pay

## 2021-09-02 MED ORDER — OXYCODONE-ACETAMINOPHEN 10-325 MG PO TABS
ORAL_TABLET | ORAL | 0 refills | Status: DC
  Filled 2021-09-02: qty 60, 30d supply, fill #0

## 2021-09-22 ENCOUNTER — Other Ambulatory Visit (HOSPITAL_COMMUNITY): Payer: Self-pay

## 2021-09-26 DIAGNOSIS — M25561 Pain in right knee: Secondary | ICD-10-CM | POA: Diagnosis not present

## 2021-10-06 ENCOUNTER — Other Ambulatory Visit (HOSPITAL_COMMUNITY): Payer: Self-pay

## 2021-10-06 MED ORDER — OXYCODONE-ACETAMINOPHEN 10-325 MG PO TABS
ORAL_TABLET | ORAL | 0 refills | Status: DC
  Filled 2021-10-06: qty 60, 30d supply, fill #0

## 2021-10-24 ENCOUNTER — Other Ambulatory Visit (HOSPITAL_COMMUNITY): Payer: Self-pay

## 2021-10-29 ENCOUNTER — Other Ambulatory Visit (HOSPITAL_COMMUNITY): Payer: Self-pay

## 2021-11-08 ENCOUNTER — Other Ambulatory Visit (HOSPITAL_COMMUNITY): Payer: Self-pay

## 2021-11-08 MED ORDER — OXYCODONE-ACETAMINOPHEN 10-325 MG PO TABS
1.0000 | ORAL_TABLET | Freq: Two times a day (BID) | ORAL | 0 refills | Status: DC
  Filled 2021-11-08: qty 60, 30d supply, fill #0

## 2021-11-23 ENCOUNTER — Other Ambulatory Visit (HOSPITAL_COMMUNITY): Payer: Self-pay

## 2021-11-24 ENCOUNTER — Other Ambulatory Visit (HOSPITAL_COMMUNITY): Payer: Self-pay

## 2021-11-26 ENCOUNTER — Other Ambulatory Visit (HOSPITAL_COMMUNITY): Payer: Self-pay

## 2021-11-27 ENCOUNTER — Other Ambulatory Visit (HOSPITAL_COMMUNITY): Payer: Self-pay

## 2021-12-08 ENCOUNTER — Other Ambulatory Visit (HOSPITAL_COMMUNITY): Payer: Self-pay

## 2021-12-08 MED ORDER — OXYCODONE-ACETAMINOPHEN 10-325 MG PO TABS
1.0000 | ORAL_TABLET | Freq: Two times a day (BID) | ORAL | 0 refills | Status: DC | PRN
  Filled 2021-12-08: qty 60, 30d supply, fill #0

## 2021-12-10 ENCOUNTER — Other Ambulatory Visit (HOSPITAL_COMMUNITY): Payer: Self-pay

## 2021-12-10 ENCOUNTER — Encounter (HOSPITAL_COMMUNITY): Payer: Self-pay | Admitting: Pharmacist

## 2021-12-11 ENCOUNTER — Other Ambulatory Visit (HOSPITAL_COMMUNITY): Payer: Self-pay

## 2021-12-12 ENCOUNTER — Other Ambulatory Visit (HOSPITAL_COMMUNITY): Payer: Self-pay

## 2021-12-13 ENCOUNTER — Other Ambulatory Visit (HOSPITAL_COMMUNITY): Payer: Self-pay

## 2021-12-15 ENCOUNTER — Other Ambulatory Visit (HOSPITAL_COMMUNITY): Payer: Self-pay

## 2021-12-22 DIAGNOSIS — Z1231 Encounter for screening mammogram for malignant neoplasm of breast: Secondary | ICD-10-CM | POA: Diagnosis not present

## 2021-12-28 ENCOUNTER — Other Ambulatory Visit (HOSPITAL_COMMUNITY): Payer: Self-pay

## 2021-12-29 ENCOUNTER — Other Ambulatory Visit (HOSPITAL_COMMUNITY): Payer: Self-pay

## 2021-12-29 MED ORDER — ALPRAZOLAM 1 MG PO TABS
1.0000 mg | ORAL_TABLET | Freq: Three times a day (TID) | ORAL | 0 refills | Status: DC | PRN
  Filled 2021-12-29: qty 90, 30d supply, fill #0

## 2021-12-30 ENCOUNTER — Other Ambulatory Visit (HOSPITAL_COMMUNITY): Payer: Self-pay

## 2021-12-30 MED ORDER — CYCLOBENZAPRINE HCL 10 MG PO TABS
10.0000 mg | ORAL_TABLET | Freq: Every evening | ORAL | 0 refills | Status: DC
  Filled 2021-12-30: qty 30, 30d supply, fill #0

## 2021-12-31 ENCOUNTER — Other Ambulatory Visit (HOSPITAL_COMMUNITY): Payer: Self-pay

## 2022-01-02 ENCOUNTER — Other Ambulatory Visit (HOSPITAL_COMMUNITY): Payer: Self-pay

## 2022-01-02 DIAGNOSIS — G894 Chronic pain syndrome: Secondary | ICD-10-CM | POA: Diagnosis not present

## 2022-01-02 DIAGNOSIS — Z0000 Encounter for general adult medical examination without abnormal findings: Secondary | ICD-10-CM | POA: Diagnosis not present

## 2022-01-02 DIAGNOSIS — Z136 Encounter for screening for cardiovascular disorders: Secondary | ICD-10-CM | POA: Diagnosis not present

## 2022-01-02 DIAGNOSIS — Z23 Encounter for immunization: Secondary | ICD-10-CM | POA: Diagnosis not present

## 2022-01-02 MED ORDER — BUSPIRONE HCL 10 MG PO TABS
5.0000 mg | ORAL_TABLET | Freq: Two times a day (BID) | ORAL | 5 refills | Status: AC | PRN
  Filled 2022-01-02: qty 60, 30d supply, fill #0
  Filled 2022-02-05: qty 60, 30d supply, fill #1
  Filled 2022-03-04: qty 60, 30d supply, fill #2
  Filled 2022-04-06: qty 60, 30d supply, fill #3
  Filled 2022-05-06: qty 60, 30d supply, fill #4
  Filled 2022-06-17 – 2022-06-22 (×3): qty 60, 30d supply, fill #5

## 2022-01-15 ENCOUNTER — Other Ambulatory Visit (HOSPITAL_COMMUNITY): Payer: Self-pay

## 2022-01-15 MED ORDER — OXYCODONE-ACETAMINOPHEN 10-325 MG PO TABS
1.0000 | ORAL_TABLET | Freq: Two times a day (BID) | ORAL | 0 refills | Status: DC | PRN
  Filled 2022-01-15: qty 60, 30d supply, fill #0

## 2022-01-16 ENCOUNTER — Other Ambulatory Visit (HOSPITAL_COMMUNITY): Payer: Self-pay

## 2022-02-04 ENCOUNTER — Other Ambulatory Visit (HOSPITAL_COMMUNITY): Payer: Self-pay

## 2022-02-05 ENCOUNTER — Other Ambulatory Visit: Payer: Self-pay

## 2022-02-20 ENCOUNTER — Other Ambulatory Visit (HOSPITAL_COMMUNITY): Payer: Self-pay

## 2022-02-21 ENCOUNTER — Other Ambulatory Visit (HOSPITAL_COMMUNITY): Payer: Self-pay

## 2022-02-21 MED ORDER — CYCLOBENZAPRINE HCL 10 MG PO TABS
ORAL_TABLET | ORAL | 0 refills | Status: DC
  Filled 2022-02-21: qty 30, 30d supply, fill #0

## 2022-02-27 ENCOUNTER — Encounter: Payer: Self-pay | Admitting: Gastroenterology

## 2022-03-04 ENCOUNTER — Other Ambulatory Visit (HOSPITAL_COMMUNITY): Payer: Self-pay

## 2022-03-06 ENCOUNTER — Other Ambulatory Visit: Payer: Self-pay

## 2022-03-06 ENCOUNTER — Other Ambulatory Visit (HOSPITAL_COMMUNITY): Payer: Self-pay

## 2022-03-06 MED ORDER — OXYCODONE-ACETAMINOPHEN 10-325 MG PO TABS
1.0000 | ORAL_TABLET | Freq: Two times a day (BID) | ORAL | 0 refills | Status: DC
  Filled 2022-03-06: qty 60, 30d supply, fill #0

## 2022-04-06 ENCOUNTER — Other Ambulatory Visit (HOSPITAL_COMMUNITY): Payer: Self-pay

## 2022-04-14 ENCOUNTER — Other Ambulatory Visit (HOSPITAL_COMMUNITY): Payer: Self-pay

## 2022-04-14 MED ORDER — OXYCODONE-ACETAMINOPHEN 10-325 MG PO TABS
1.0000 | ORAL_TABLET | Freq: Two times a day (BID) | ORAL | 0 refills | Status: DC | PRN
  Filled 2022-04-14 (×2): qty 60, 30d supply, fill #0

## 2022-04-15 ENCOUNTER — Other Ambulatory Visit (HOSPITAL_COMMUNITY): Payer: Self-pay

## 2022-04-16 ENCOUNTER — Ambulatory Visit (AMBULATORY_SURGERY_CENTER): Payer: Medicare Other

## 2022-04-16 VITALS — Ht 59.0 in | Wt 127.0 lb

## 2022-04-16 DIAGNOSIS — Z86010 Personal history of colonic polyps: Secondary | ICD-10-CM

## 2022-04-16 MED ORDER — NA SULFATE-K SULFATE-MG SULF 17.5-3.13-1.6 GM/177ML PO SOLN
1.0000 | Freq: Once | ORAL | 0 refills | Status: AC
Start: 2022-04-16 — End: 2022-04-16

## 2022-04-16 NOTE — Progress Notes (Signed)
No egg or soy allergy known to patient  No issues known to pt with past sedation with any surgeries or procedures Patient denies ever being told they had issues or difficulty with intubation  No FH of Malignant Hyperthermia Pt is not on diet pills Pt is not on  home 02  Pt is not on blood thinners  Pt denies issues with constipation  No A fib or A flutter Have any cardiac testing pending--NO Pt instructed to use Singlecare.com or GoodRx for a price reduction on prep   Patient's chart reviewed by Osvaldo Angst CNRA prior to previsit and patient appropriate for the [facility].  Previsit completed and red dot placed by patient's name on their procedure day (on provider's schedule).

## 2022-04-17 ENCOUNTER — Encounter: Payer: Self-pay | Admitting: Gastroenterology

## 2022-05-06 ENCOUNTER — Other Ambulatory Visit (HOSPITAL_COMMUNITY): Payer: Self-pay

## 2022-05-09 ENCOUNTER — Other Ambulatory Visit (HOSPITAL_COMMUNITY): Payer: Self-pay

## 2022-05-09 MED ORDER — CYCLOBENZAPRINE HCL 10 MG PO TABS
10.0000 mg | ORAL_TABLET | Freq: Every evening | ORAL | 0 refills | Status: DC
  Filled 2022-05-09 – 2022-05-11 (×2): qty 30, 30d supply, fill #0

## 2022-05-11 ENCOUNTER — Encounter: Payer: Self-pay | Admitting: Gastroenterology

## 2022-05-11 ENCOUNTER — Ambulatory Visit (AMBULATORY_SURGERY_CENTER): Payer: Medicare Other | Admitting: Gastroenterology

## 2022-05-11 ENCOUNTER — Other Ambulatory Visit (HOSPITAL_COMMUNITY): Payer: Self-pay

## 2022-05-11 ENCOUNTER — Other Ambulatory Visit: Payer: Self-pay

## 2022-05-11 VITALS — BP 135/62 | HR 69 | Temp 99.3°F | Resp 18 | Ht 59.0 in | Wt 125.0 lb

## 2022-05-11 DIAGNOSIS — Z09 Encounter for follow-up examination after completed treatment for conditions other than malignant neoplasm: Secondary | ICD-10-CM

## 2022-05-11 DIAGNOSIS — Z86010 Personal history of colonic polyps: Secondary | ICD-10-CM | POA: Diagnosis not present

## 2022-05-11 MED ORDER — SODIUM CHLORIDE 0.9 % IV SOLN: 500.0000 mL | Freq: Once | INTRAVENOUS | Status: DC

## 2022-05-11 NOTE — Progress Notes (Signed)
Vss nad trans to pacu 

## 2022-05-11 NOTE — Op Note (Signed)
[facility] Patient Name: [name] Procedure Date: 05/11/2022 8:36 AM MRN: [id number] Endoscopist: Mauri Pole , MD, [id number] Age: 67 Referring MD:  Date of Birth: 1955-06-11 Gender: Female Account #: [id number] Procedure:                Colonoscopy Indications:              High risk colon cancer surveillance: Personal                            history of colonic polyps, High risk colon cancer                            surveillance: Personal history of adenoma less than                            10 mm in size Procedure:                Pre-Anesthesia Assessment:                           - Prior to the procedure, a History and Physical                            was performed, and patient medications and                            allergies were reviewed. The patient's tolerance of                            previous anesthesia was also reviewed. The risks                            and benefits of the procedure and the sedation                            options and risks were discussed with the patient.                            All questions were answered, and informed consent                            was obtained. Prior Anticoagulants: The patient has                            taken no anticoagulant or antiplatelet agents. ASA                            Grade Assessment: II - A patient with mild systemic                            disease. After reviewing the risks and benefits,                            the patient was deemed in  satisfactory condition to                            undergo the procedure.                           After obtaining informed consent, the colonoscope                            was passed under direct vision. Throughout the                            procedure, the patient's blood pressure, pulse, and                            oxygen saturations were monitored continuously. The                            Olympus  [id number]-[id number]) Colonoscope was                            introduced through the anus and advanced to the the                            cecum, identified by appendiceal orifice and                            ileocecal valve. The colonoscopy was performed                            without difficulty. The patient tolerated the                            procedure well. The quality of the bowel                            preparation was adequate. The ileocecal valve,                            appendiceal orifice, and rectum were photographed. Scope In: 8:40:30 AM Scope Out: 8:58:53 AM Scope Withdrawal Time: 0 hours 14 minutes 30 seconds  Total Procedure Duration: 0 hours 18 minutes 23 seconds  Findings:                 The perianal and digital rectal examinations were                            normal.                           Scattered small-mouthed diverticula were found in                            the sigmoid colon, descending colon and transverse  colon. There was no evidence of diverticular                            bleeding.                           Non-bleeding external and internal hemorrhoids were                            found during retroflexion. The hemorrhoids were                            medium-sized.                           The exam was otherwise without abnormality. Complications:            No immediate complications. Estimated Blood Loss:     Estimated blood loss was minimal. Impression:               - Mild diverticulosis in the sigmoid colon, in the                            descending colon and in the transverse colon. There                            was no evidence of diverticular bleeding.                           - Non-bleeding external and internal hemorrhoids.                           - The examination was otherwise normal.                           - No specimens collected.                           - The GI  Genius (intelligent endoscopy module),                            computer-aided polyp detection system powered by AI                            was utilized to detect colorectal polyps through                            enhanced visualization during colonoscopy. Recommendation:           - Patient has a contact number available for                            emergencies. The signs and symptoms of potential                            delayed complications were discussed with the  patient. Return to normal activities tomorrow.                            Written discharge instructions were provided to the                            patient.                           - Resume previous diet.                           - Continue present medications.                           - Repeat colonoscopy in 10 years for surveillance. Mauri Pole, MD 05/11/2022 9:10:30 AM This report has been signed electronically.

## 2022-05-11 NOTE — Progress Notes (Unsigned)
[facility] History and Physical   Primary Care Physician:  Shirline Frees, MD   Reason for Procedure:  History of adenomatous colon polyps  Plan:    Surveillance colonoscopy with possible interventions as needed     HPI: [name] is a very pleasant 67 y.o. female here for surveillance colonoscopy. Denies any nausea, vomiting, abdominal pain, melena or bright red blood per rectum  The risks and benefits as well as alternatives of endoscopic procedure(s) have been discussed and reviewed. All questions answered. The patient agrees to proceed.    Past Medical History:  Diagnosis Date   Anxiety    Arthritis    Cataract    Chronic neck and back pain    Depression    GERD (gastroesophageal reflux disease)     Past Surgical History:  Procedure Laterality Date   ABDOMINAL HYSTERECTOMY     APPENDECTOMY     BACK SURGERY     cervical sx. by Dr. Sherley Bounds   BREAST SURGERY     bilateral breast reduction   CERVICAL SPINE SURGERY     CHOLECYSTECTOMY     EYE SURGERY Bilateral    laser; cataract   FRACTURE SURGERY Left    wrist   left shoulder surgery     LUMBAR LAMINECTOMY/DECOMPRESSION MICRODISCECTOMY N/A 07/05/2019   Procedure: Lumbar two-three decompression and discectomy;  Surgeon: Eustace Moore, MD;  Location: [facility];  Service: Neurosurgery;  Laterality: N/A;   TONSILLECTOMY     WRIST SURGERY      Prior to Admission medications   Medication Sig Start Date End Date Taking? Authorizing Provider  busPIRone (BUSPAR) 10 MG tablet Take 0.5-1 tablets (5-10 mg total) by mouth 2 (two) times daily as needed for anxiety. 01/02/22  Yes   Carboxymeth-Glycerin-Polysorb (REFRESH OPTIVE MEGA-3 OP) Place 1 drop into both eyes 5 (five) times daily as needed (dry eyes).   Yes [provider]  Cholecalciferol (DIALYVITE VITAMIN D 5000) 125 MCG (5000 UT) capsule Take 5,000 Units by mouth in the morning and at bedtime.   Yes [provider]   cyclobenzaprine (FLEXERIL) 10 MG tablet Take 1 tablet (10 mg total) by mouth at bedtime as needed for muscle spasm. 05/08/22  Yes   escitalopram (LEXAPRO) 20 MG tablet Take 20 mg by mouth daily. 07/02/17  Yes [provider]  escitalopram (LEXAPRO) 20 MG tablet Take 1 tablet (20 mg total) by mouth daily. 08/04/21  Yes   Flaxseed, Linseed, (FLAX SEED OIL) 1000 MG CAPS Take 1,000 mg by mouth daily.   Yes [provider]  Misc Natural Products (NEURIVA) CAPS Take 1 capsule by mouth daily.   Yes [provider]  Multiple Vitamin (MULTIVITAMIN WITH MINERALS) TABS tablet Take 1 tablet by mouth daily. Centrum   Yes [provider]  nabumetone (RELAFEN) 500 MG tablet Take 500 mg by mouth 2 (two) times daily as needed for moderate pain.   Yes [provider]  oxyCODONE-acetaminophen (PERCOCET) 10-325 MG per tablet Take 0.5-1 tablets by mouth 3 (three) times daily as needed for pain.    Yes [provider]  Turmeric 500 MG CAPS Take 500 mg by mouth 2 (two) times daily.   Yes [provider]  gabapentin (NEURONTIN) 100 MG capsule Take 100 mg by mouth at bedtime as needed (back pain). Patient not taking: Reported on 04/16/2022    [provider]    Current Outpatient Medications  Medication Sig Dispense Refill   busPIRone (BUSPAR) 10 MG  tablet Take 0.5-1 tablets (5-10 mg total) by mouth 2 (two) times daily as needed for anxiety. 60 tablet 5   Carboxymeth-Glycerin-Polysorb (REFRESH OPTIVE MEGA-3 OP) Place 1 drop into both eyes 5 (five) times daily as needed (dry eyes).     Cholecalciferol (DIALYVITE VITAMIN D 5000) 125 MCG (5000 UT) capsule Take 5,000 Units by mouth in the morning and at bedtime.     cyclobenzaprine (FLEXERIL) 10 MG tablet Take 1 tablet (10 mg total) by mouth at bedtime as needed for muscle spasm. 30 tablet 0   escitalopram (LEXAPRO) 20 MG tablet Take 20 mg by mouth daily.  5   escitalopram (LEXAPRO) 20 MG tablet Take 1  tablet (20 mg total) by mouth daily. 90 tablet 1   Flaxseed, Linseed, (FLAX SEED OIL) 1000 MG CAPS Take 1,000 mg by mouth daily.     Misc Natural Products (NEURIVA) CAPS Take 1 capsule by mouth daily.     Multiple Vitamin (MULTIVITAMIN WITH MINERALS) TABS tablet Take 1 tablet by mouth daily. Centrum     nabumetone (RELAFEN) 500 MG tablet Take 500 mg by mouth 2 (two) times daily as needed for moderate pain.     oxyCODONE-acetaminophen (PERCOCET) 10-325 MG per tablet Take 0.5-1 tablets by mouth 3 (three) times daily as needed for pain.      Turmeric 500 MG CAPS Take 500 mg by mouth 2 (two) times daily.     gabapentin (NEURONTIN) 100 MG capsule Take 100 mg by mouth at bedtime as needed (back pain). (Patient not taking: Reported on 04/16/2022)     Current Facility-Administered Medications  Medication Dose Route Frequency Provider Last Rate Last Admin   0.9 %  sodium chloride infusion  500 mL Intravenous Once Mauri Pole, MD        Allergies as of 05/11/2022 - Review Complete 05/11/2022  Allergen Reaction Noted   Codeine Nausea And Vomiting 07/16/2012   Sertraline Other (See Comments) 11/13/2016   Prednisone Anxiety 06/14/2019    Family History  Problem Relation Age of Onset   Colon cancer Neg Hx    Esophageal cancer Neg Hx    Pancreatic cancer Neg Hx    Rectal cancer Neg Hx    Stomach cancer Neg Hx    Colon polyps Neg Hx     Social History   Socioeconomic History   Marital status: Married    Spouse name: Not on file   Number of children: Not on file   Years of education: Not on file   Highest education level: Not on file  Occupational History   Not on file  Tobacco Use   Smoking status: Former    Types: Cigarettes   Smokeless tobacco: Never   Tobacco comments:    Quit in 2015  Vaping Use   Vaping Use: Never used  Substance and Sexual Activity   Alcohol use: Not on file    Comment: occ wine and beer.    Drug use: No   Sexual activity: Not on file  Other Topics  Concern   Not on file  Social History Narrative   Not on file   Social Determinants of Health   Financial Resource Strain: Not on file  Food Insecurity: Not on file  Transportation Needs: Not on file  Physical Activity: Not on file  Stress: Not on file  Social Connections: Not on file  Intimate Partner Violence: Not on file    Review of Systems:  All other review of systems negative except as  mentioned in the HPI.  Physical Exam: Vital signs in last 24 hours: Blood Pressure 127/79   Pulse 80   Temperature 99.3 F (37.4 C)   Height 4\' 11"  (1.499 m)   Weight 125 lb (56.7 kg)   Oxygen Saturation 97%   Body Mass Index 25.25 kg/m  General:   Alert, NAD Lungs:  Clear .   Heart:  Regular rate and rhythm Abdomen:  Soft, nontender and nondistended. Neuro/Psych:  Alert and cooperative. Normal mood and affect. A and O x 3  Reviewed labs, radiology imaging, old records and pertinent past GI work up  Patient is appropriate for planned procedure(s) and anesthesia in an ambulatory setting   K. Denzil Magnuson , MD [phone]

## 2022-05-11 NOTE — Patient Instructions (Addendum)
YOU HAD AN ENDOSCOPIC PROCEDURE TODAY AT [facility]:   Refer to the procedure report that was given to you for any specific questions about what was found during the examination.  If the procedure report does not answer your questions, please call your gastroenterologist to clarify.  If you requested that your care partner not be given the details of your procedure findings, then the procedure report has been included in a sealed envelope for you to review at your convenience later.  Resume previous diet. Continue present medications. Repeat colonoscopy in 10 years for surveillance.  Please read over handouts about hemorrhoids and diverticulosis   YOU SHOULD EXPECT: Some feelings of bloating in the abdomen. Passage of more gas than usual.  Walking can help get rid of the air that was put into your GI tract during the procedure and reduce the bloating. If you had a lower endoscopy (such as a colonoscopy or flexible sigmoidoscopy) you may notice spotting of blood in your stool or on the toilet paper. If you underwent a bowel prep for your procedure, you may not have a normal bowel movement for a few days.  Please Note:  You might notice some irritation and congestion in your nose or some drainage.  This is from the oxygen used during your procedure.  There is no need for concern and it should clear up in a day or so.  SYMPTOMS TO REPORT IMMEDIATELY:  Following lower endoscopy (colonoscopy or flexible sigmoidoscopy):  Excessive amounts of blood in the stool  Significant tenderness or worsening of abdominal pains  Swelling of the abdomen that is new, acute  Fever of 100F or higher For urgent or emergent issues, a gastroenterologist can be reached at any hour by calling [phone]. Do not use MyChart messaging for urgent concerns.    DIET:  We do recommend a small meal at first, but then you may proceed to your regular diet.  Drink plenty of fluids but you should avoid  alcoholic beverages for 24 hours.  ACTIVITY:  You should plan to take it easy for the rest of today and you should NOT DRIVE or use heavy machinery until tomorrow (because of the sedation medicines used during the test).    FOLLOW UP: Our staff will call the number listed on your records the next business day following your procedure.  We will call around 7:15- 8:00 am to check on you and address any questions or concerns that you may have regarding the information given to you following your procedure. If we do not reach you, we will leave a message.      SIGNATURES/CONFIDENTIALITY: You and/or your care partner have signed paperwork which will be entered into your electronic medical record.  These signatures attest to the fact that that the information above on your After Visit Summary has been reviewed and is understood.  Full responsibility of the confidentiality of this discharge information lies with you and/or your care-partner.

## 2022-05-12 ENCOUNTER — Telehealth: Payer: Self-pay

## 2022-05-12 NOTE — Telephone Encounter (Signed)
  Follow up Call-     05/11/2022    7:57 AM  Call back number  Post procedure Call Back phone  # [phone]     Patient questions:  Do you have a fever, pain , or abdominal swelling? No. Pain Score  0 *  Have you tolerated food without any problems? Yes.    Have you been able to return to your normal activities? Yes.    Do you have any questions about your discharge instructions: Diet   No. Medications  No. Follow up visit  No.  Do you have questions or concerns about your Care? No.  Actions: * If pain score is 4 or above: No action needed, pain <4.

## 2022-05-20 ENCOUNTER — Other Ambulatory Visit (HOSPITAL_COMMUNITY): Payer: Self-pay

## 2022-05-20 MED ORDER — OXYCODONE-ACETAMINOPHEN 10-325 MG PO TABS
1.0000 | ORAL_TABLET | Freq: Two times a day (BID) | ORAL | 0 refills | Status: AC | PRN
  Filled 2022-05-20: qty 60, 30d supply, fill #0

## 2022-05-23 ENCOUNTER — Other Ambulatory Visit (HOSPITAL_COMMUNITY): Payer: Self-pay

## 2022-06-02 ENCOUNTER — Other Ambulatory Visit: Payer: Self-pay

## 2022-06-02 ENCOUNTER — Ambulatory Visit (HOSPITAL_COMMUNITY)
Admission: EM | Admit: 2022-06-02 | Discharge: 2022-06-02 | Disposition: A | Discharge status: Home / Self Care | Payer: Medicare Other | Attending: Family Medicine | Admitting: Family Medicine

## 2022-06-02 ENCOUNTER — Ambulatory Visit (INDEPENDENT_AMBULATORY_CARE_PROVIDER_SITE_OTHER): Payer: Medicare Other

## 2022-06-02 ENCOUNTER — Encounter (HOSPITAL_COMMUNITY): Payer: Self-pay | Admitting: Emergency Medicine

## 2022-06-02 DIAGNOSIS — M5441 Lumbago with sciatica, right side: Secondary | ICD-10-CM

## 2022-06-02 DIAGNOSIS — M545 Low back pain, unspecified: Secondary | ICD-10-CM | POA: Diagnosis not present

## 2022-06-02 MED ORDER — KETOROLAC TROMETHAMINE 30 MG/ML IJ SOLN
INTRAMUSCULAR | Status: AC
  Filled 2022-06-02: qty 1

## 2022-06-02 MED ORDER — KETOROLAC TROMETHAMINE 30 MG/ML IJ SOLN
30.0000 mg | Freq: Once | INTRAMUSCULAR | Status: AC
  Administered 2022-06-02: 30 mg via INTRAMUSCULAR

## 2022-06-02 MED ORDER — NABUMETONE 500 MG PO TABS: 500.0000 mg | ORAL_TABLET | Freq: Two times a day (BID) | ORAL | 0 refills | Status: AC | PRN

## 2022-06-02 NOTE — ED Provider Notes (Signed)
[facility]    CSN: [id number] Arrival date & time: 06/02/22  1320      History   Chief Complaint Chief Complaint  Patient presents with   Back Pain    HPI Ixchel L Toney Sang is a 67 y.o. female.    Back Pain  Here for right low back pain. For about 2 weeks she has had some right lower back discomfort.  Then on April 11 she was leaning over changing a doctor for her grandchild when she felt a sudden catch in her right lower back.  It now hurts to sit or tingling forward.  There is pain radiating into her right lateral proximal thigh.  She has had L-spine surgery in 2021 with Dr. Marikay Alar.  Half of Percocet which is what she usually takes for her chronic pain as needed has not been helping.  Holding did help yesterday but it makes her too sleepy to function.  In the past she notes nabumetone has been helpful for her pain, but she does not have any at home.   No trauma or fall.  No fever or chills and no dysuria or hematuria Past Medical History:  Diagnosis Date   Anxiety    Arthritis    Cataract    Chronic neck and back pain    Depression    GERD (gastroesophageal reflux disease)     Patient Active Problem List   Diagnosis Date Noted   Pain of left hand 10/30/2019   Encounter for orthopedic follow-up care 10/12/2019   Cellulitis of right hand 10/11/2019   Laceration of finger of left hand 10/11/2019   Pain in right hand 10/11/2019   S/P lumbar fusion 07/05/2019    Past Surgical History:  Procedure Laterality Date   ABDOMINAL HYSTERECTOMY     APPENDECTOMY     BACK SURGERY     cervical sx. by Dr. Marikay Alar   BREAST SURGERY     bilateral breast reduction   CERVICAL SPINE SURGERY     CHOLECYSTECTOMY     EYE SURGERY Bilateral    laser; cataract   FRACTURE SURGERY Left    wrist   left shoulder surgery     LUMBAR LAMINECTOMY/DECOMPRESSION MICRODISCECTOMY N/A 07/05/2019   Procedure: Lumbar two-three decompression and discectomy;  Surgeon:  Tia Alert, MD;  Location: [facility];  Service: Neurosurgery;  Laterality: N/A;   TONSILLECTOMY     WRIST SURGERY      OB History   No obstetric history on file.      Home Medications    Prior to Admission medications   Medication Sig Start Date End Date Taking? Authorizing Provider  busPIRone (BUSPAR) 10 MG tablet Take 0.5-1 tablets (5-10 mg total) by mouth 2 (two) times daily as needed for anxiety. Patient taking differently: Take 5-10 mg by mouth 2 (two) times daily as needed. RANDOMLY TAKING 01/02/22     Carboxymeth-Glycerin-Polysorb (REFRESH OPTIVE MEGA-3 OP) Place 1 drop into both eyes 5 (five) times daily as needed (dry eyes).    [provider]  Cholecalciferol (DIALYVITE VITAMIN D 5000) 125 MCG (5000 UT) capsule Take 5,000 Units by mouth in the morning and at bedtime.    [provider]  cyclobenzaprine (FLEXERIL) 10 MG tablet Take 1 tablet (10 mg total) by mouth at bedtime as needed for muscle spasm. 05/08/22     escitalopram (LEXAPRO) 20 MG tablet Take 20 mg by mouth daily. 07/02/17   [provider]  escitalopram (LEXAPRO) 20 MG  tablet Take 1 tablet (20 mg total) by mouth daily. 08/04/21     Flaxseed, Linseed, (FLAX SEED OIL) 1000 MG CAPS Take 1,000 mg by mouth daily.    [provider]  gabapentin (NEURONTIN) 100 MG capsule Take 100 mg by mouth at bedtime as needed (back pain). Patient not taking: Reported on 04/16/2022    [provider]  Misc Natural Products (NEURIVA) CAPS Take 1 capsule by mouth daily.    [provider]  Multiple Vitamin (MULTIVITAMIN WITH MINERALS) TABS tablet Take 1 tablet by mouth daily. Centrum    [provider]  nabumetone (RELAFEN) 500 MG tablet Take 1 tablet (500 mg total) by mouth 2 (two) times daily as needed for moderate pain. 06/02/22   Zenia Resides, MD  oxyCODONE-acetaminophen (PERCOCET) 10-325 MG per tablet Take 0.5-1 tablets by mouth 3 (three) times daily as needed for pain.      [provider]  oxyCODONE-acetaminophen (PERCOCET) 10-325 MG tablet Take 1 tablet by mouth 2 (two) times daily as needed. 05/20/22     Turmeric 500 MG CAPS Take 500 mg by mouth 2 (two) times daily.    [provider]    Family History Family History  Problem Relation Age of Onset   Colon cancer Neg Hx    Esophageal cancer Neg Hx    Pancreatic cancer Neg Hx    Rectal cancer Neg Hx    Stomach cancer Neg Hx    Colon polyps Neg Hx     Social History Social History   Tobacco Use   Smoking status: Former    Types: Cigarettes   Smokeless tobacco: Never   Tobacco comments:    Quit in 2015  Vaping Use   Vaping Use: Never used  Substance Use Topics   Alcohol use: Yes    Comment: occ wine and beer.    Drug use: No     Allergies   Codeine, Sertraline, and Prednisone   Review of Systems Review of Systems  Musculoskeletal:  Positive for back pain.     Physical Exam Triage Vital Signs ED Triage Vitals  Enc Vitals Group     BP 06/02/22 1419 (!) 150/96     Pulse Rate 06/02/22 1419 85     Resp 06/02/22 1419 20     Temp 06/02/22 1419 97.7 F (36.5 C)     Temp Source 06/02/22 1419 Oral     SpO2 06/02/22 1419 98 %     Weight --      Height --      Head Circumference --      Peak Flow --      Pain Score 06/02/22 1416 9     Pain Loc --      Pain Edu? --      Excl. in GC? --    No data found.  Updated Vital Signs BP (!) 150/96 (BP Location: Right Arm)   Pulse 85   Temp 97.7 F (36.5 C) (Oral)   Resp 20   SpO2 98%   Visual Acuity Right Eye Distance:   Left Eye Distance:   Bilateral Distance:    Right Eye Near:   Left Eye Near:    Bilateral Near:     Physical Exam Vitals reviewed.  Constitutional:      General: She is not in acute distress.    Appearance: She is not ill-appearing, toxic-appearing or diaphoretic.  Musculoskeletal:     Comments: There is some tenderness of the  right lumbosacral area.  No rash and no erythema or deformity   Skin:    Findings: No rash.  Neurological:     General: No focal deficit present.     Mental Status: She is alert and oriented to person, place, and time.  Psychiatric:        Behavior: Behavior normal.      UC Treatments / Results  Labs (all labs ordered are listed, but only abnormal results are displayed) Labs Reviewed - No data to display  EKG   Radiology DG Lumbar Spine 2-3 Views  Result Date: 06/02/2022 CLINICAL DATA:  Right lower back pain. EXAM: LUMBAR SPINE - 2-3 VIEW COMPARISON:  October 01, 2020. FINDINGS: Status post surgical posterior fusion of L4-5 with bilateral epididymis screw placement and interbody fusion. No acute fracture is noted. No significant spondylolisthesis is noted. Moderate degenerative disc disease is noted at L1-2 and L2-3 with anterior osteophyte formation. IMPRESSION: Postsurgical and degenerative changes as described above. No acute abnormality seen. Electronically Signed   By: Lupita Raider M.D.   On: 06/02/2022 15:11    Procedures Procedures (including critical care time)  Medications Ordered in UC Medications  ketorolac (TORADOL) 30 MG/ML injection 30 mg (has no administration in time range)    Initial Impression / Assessment and Plan / UC Course  I have reviewed the triage vital signs and the nursing notes.  Pertinent labs & imaging results that were available during my care of the patient were reviewed by me and considered in my medical decision making (see chart for details).        X-rays did not show any acute bony abnormality.  Do show the chronic changes with the surgical hardware in place.  Toradol injection is given here, and Relafen is sent in for her to take at home.  She will contact her primary care and her spine surgeon Final Clinical Impressions(s) / UC Diagnoses   Final diagnoses:  Acute right-sided low back pain with right-sided sciatica     Discharge Instructions      The x-rays do not show any bony  problems like fractures.  It looks like the hardware is in place.  You have been given a shot of Toradol 30 mg today.\  Take nabumetone 500 mg--1 tablet 2 times daily as needed for pain.  Follow-up with your primary care and your spine surgeon     ED Prescriptions     Medication Sig Dispense Auth. Provider   nabumetone (RELAFEN) 500 MG tablet Take 1 tablet (500 mg total) by mouth 2 (two) times daily as needed for moderate pain. 60 tablet Mohd Clemons, Janace Aris, MD      I have reviewed the PDMP during this encounter.   Zenia Resides, MD 06/02/22 [id number]

## 2022-06-02 NOTE — Discharge Instructions (Signed)
The x-rays do not show any bony problems like fractures.  It looks like the hardware is in place.  You have been given a shot of Toradol 30 mg today.\  Take nabumetone 500 mg--1 tablet 2 times daily as needed for pain.  Follow-up with your primary care and your spine surgeon

## 2022-06-02 NOTE — ED Triage Notes (Addendum)
History of back problems.  2 weeks ago started having right lower back, hip and radiating down right leg.  Patient has a very difficult tome trying to sleep last night.   Patient was changing a diaper  of a grandchild on Thursday.  While leaning forward, reached with right hand across body and felt something pinch in back.  Back had already been hurting for 2 weeks.    Back surgery was performed by Dr Marikay Alar 2021  Patient does have a muscle relaxer and percocet, but has not taken any medicines today

## 2022-06-04 DIAGNOSIS — M545 Low back pain, unspecified: Secondary | ICD-10-CM | POA: Diagnosis not present

## 2022-06-05 ENCOUNTER — Other Ambulatory Visit (HOSPITAL_COMMUNITY): Payer: Self-pay

## 2022-06-17 ENCOUNTER — Other Ambulatory Visit (HOSPITAL_COMMUNITY): Payer: Self-pay

## 2022-06-17 MED ORDER — CYCLOBENZAPRINE HCL 10 MG PO TABS
10.0000 mg | ORAL_TABLET | Freq: Every evening | ORAL | 0 refills | Status: AC
  Filled 2022-06-17 (×2): qty 30, 30d supply, fill #0

## 2022-06-22 ENCOUNTER — Other Ambulatory Visit: Payer: Self-pay

## 2022-06-22 ENCOUNTER — Other Ambulatory Visit (HOSPITAL_COMMUNITY): Payer: Self-pay

## 2022-07-02 DIAGNOSIS — H6123 Impacted cerumen, bilateral: Secondary | ICD-10-CM | POA: Diagnosis not present

## 2022-07-02 DIAGNOSIS — G894 Chronic pain syndrome: Secondary | ICD-10-CM | POA: Diagnosis not present

## 2022-07-08 ENCOUNTER — Other Ambulatory Visit (HOSPITAL_COMMUNITY): Payer: Self-pay

## 2022-07-08 MED ORDER — BUSPIRONE HCL 30 MG PO TABS
30.0000 mg | ORAL_TABLET | Freq: Two times a day (BID) | ORAL | 5 refills | Status: AC | PRN
  Filled 2022-07-08: qty 60, 30d supply, fill #0
  Filled 2022-08-17: qty 60, 30d supply, fill #1
  Filled 2022-09-23: qty 60, 30d supply, fill #2
  Filled 2022-10-20: qty 60, 30d supply, fill #3
  Filled 2022-11-19 – 2022-11-23 (×2): qty 60, 30d supply, fill #4
  Filled 2022-12-21: qty 60, 30d supply, fill #5

## 2022-08-17 ENCOUNTER — Other Ambulatory Visit (HOSPITAL_COMMUNITY): Payer: Self-pay

## 2022-09-23 ENCOUNTER — Other Ambulatory Visit (HOSPITAL_COMMUNITY): Payer: Self-pay

## 2022-10-21 ENCOUNTER — Other Ambulatory Visit (HOSPITAL_COMMUNITY): Payer: Self-pay

## 2022-11-19 ENCOUNTER — Other Ambulatory Visit (HOSPITAL_COMMUNITY): Payer: Self-pay

## 2022-11-23 ENCOUNTER — Other Ambulatory Visit (HOSPITAL_COMMUNITY): Payer: Self-pay

## 2022-11-30 ENCOUNTER — Other Ambulatory Visit (HOSPITAL_COMMUNITY): Payer: Self-pay

## 2022-12-21 ENCOUNTER — Other Ambulatory Visit (HOSPITAL_COMMUNITY): Payer: Self-pay

## 2022-12-24 ENCOUNTER — Other Ambulatory Visit (HOSPITAL_COMMUNITY): Payer: Self-pay

## 2022-12-27 DIAGNOSIS — H43393 Other vitreous opacities, bilateral: Secondary | ICD-10-CM | POA: Diagnosis not present

## 2023-01-25 DIAGNOSIS — G894 Chronic pain syndrome: Secondary | ICD-10-CM | POA: Diagnosis not present

## 2023-01-25 DIAGNOSIS — R0789 Other chest pain: Secondary | ICD-10-CM | POA: Diagnosis not present

## 2023-01-25 DIAGNOSIS — M85852 Other specified disorders of bone density and structure, left thigh: Secondary | ICD-10-CM | POA: Diagnosis not present

## 2023-01-25 DIAGNOSIS — E7800 Pure hypercholesterolemia, unspecified: Secondary | ICD-10-CM | POA: Diagnosis not present

## 2023-01-25 DIAGNOSIS — Z0000 Encounter for general adult medical examination without abnormal findings: Secondary | ICD-10-CM | POA: Diagnosis not present

## 2023-01-25 DIAGNOSIS — F5101 Primary insomnia: Secondary | ICD-10-CM | POA: Diagnosis not present

## 2023-01-27 DIAGNOSIS — Z1231 Encounter for screening mammogram for malignant neoplasm of breast: Secondary | ICD-10-CM | POA: Diagnosis not present

## 2023-03-02 DIAGNOSIS — M8588 Other specified disorders of bone density and structure, other site: Secondary | ICD-10-CM | POA: Diagnosis not present

## 2023-07-29 DIAGNOSIS — M25561 Pain in right knee: Secondary | ICD-10-CM | POA: Diagnosis not present

## 2023-07-29 DIAGNOSIS — M79604 Pain in right leg: Secondary | ICD-10-CM | POA: Diagnosis not present

## 2023-07-30 ENCOUNTER — Ambulatory Visit (HOSPITAL_COMMUNITY)
Admission: RE | Admit: 2023-07-30 | Discharge: 2023-07-30 | Disposition: A | Discharge status: Home / Self Care | Source: Ambulatory Visit | Attending: Vascular Surgery | Admitting: Vascular Surgery

## 2023-07-30 ENCOUNTER — Other Ambulatory Visit (HOSPITAL_COMMUNITY): Payer: Self-pay | Admitting: Medical

## 2023-07-30 DIAGNOSIS — M79661 Pain in right lower leg: Secondary | ICD-10-CM | POA: Diagnosis not present

## 2023-07-30 DIAGNOSIS — M7989 Other specified soft tissue disorders: Secondary | ICD-10-CM

## 2023-08-16 DIAGNOSIS — M7121 Synovial cyst of popliteal space [Baker], right knee: Secondary | ICD-10-CM | POA: Diagnosis not present

## 2023-08-30 DIAGNOSIS — F5101 Primary insomnia: Secondary | ICD-10-CM | POA: Diagnosis not present

## 2023-08-30 DIAGNOSIS — G894 Chronic pain syndrome: Secondary | ICD-10-CM | POA: Diagnosis not present

## 2023-08-30 DIAGNOSIS — E7800 Pure hypercholesterolemia, unspecified: Secondary | ICD-10-CM | POA: Diagnosis not present

## 2023-10-01 ENCOUNTER — Encounter (HOSPITAL_COMMUNITY): Payer: Self-pay

## 2023-10-01 ENCOUNTER — Other Ambulatory Visit (HOSPITAL_COMMUNITY): Payer: Self-pay

## 2023-10-01 MED ORDER — ESCITALOPRAM OXALATE 20 MG PO TABS
20.0000 mg | ORAL_TABLET | Freq: Every day | ORAL | 1 refills | Status: AC
  Filled 2023-10-01: qty 90, 90d supply, fill #0

## 2023-10-04 ENCOUNTER — Other Ambulatory Visit: Payer: Self-pay

## 2023-10-05 ENCOUNTER — Other Ambulatory Visit: Payer: Self-pay

## 2024-01-05 ENCOUNTER — Other Ambulatory Visit (HOSPITAL_COMMUNITY): Payer: Self-pay
